# Patient Record
Sex: Male | Born: 1940 | Race: White | Hispanic: No | Marital: Married
Health system: Midwestern US, Academic
[De-identification: ages and names within clinical notes are randomized; demographics above are authoritative.]

---

## 2017-05-12 ENCOUNTER — Encounter: Admit: 2017-05-12 | Discharge: 2017-05-12 | Payer: MEDICARE

## 2017-05-27 ENCOUNTER — Ambulatory Visit: Admit: 2017-05-27 | Discharge: 2017-05-28 | Payer: MEDICARE

## 2017-05-27 ENCOUNTER — Encounter: Admit: 2017-05-27 | Discharge: 2017-05-27 | Payer: MEDICARE

## 2017-05-27 DIAGNOSIS — I4891 Unspecified atrial fibrillation: ICD-10-CM

## 2017-05-27 DIAGNOSIS — E785 Hyperlipidemia, unspecified: ICD-10-CM

## 2017-05-27 DIAGNOSIS — Z950 Presence of cardiac pacemaker: ICD-10-CM

## 2017-05-27 DIAGNOSIS — Z136 Encounter for screening for cardiovascular disorders: ICD-10-CM

## 2017-05-27 DIAGNOSIS — I480 Paroxysmal atrial fibrillation: ICD-10-CM

## 2017-05-27 DIAGNOSIS — R55 Syncope and collapse: ICD-10-CM

## 2017-05-27 DIAGNOSIS — I6523 Occlusion and stenosis of bilateral carotid arteries: Principal | ICD-10-CM

## 2017-05-27 DIAGNOSIS — I2510 Atherosclerotic heart disease of native coronary artery without angina pectoris: Principal | ICD-10-CM

## 2017-05-27 DIAGNOSIS — I10 Essential (primary) hypertension: ICD-10-CM

## 2017-05-27 NOTE — Progress Notes
Date of Service: 05/27/2017    Joshua Keller is a 76 y.o. male.       HPI     Joshua Keller was in the [place] office today for follow-up regarding his rather complicated cardiovascular disease.  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  Basically, I think he is doing well from a cardiovascular standpoint.    He is not having any angina symptoms.  His had no syncope.  He feels that he is slowing down a little bit but has not had specific problems with breathlessness.  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  His fluid status is stable and he has not had problems with fluid retention or claudication.         Vitals:    05/27/17 1422 05/27/17 1432   BP: 126/82 120/86   Pulse: 81    Weight: 85 kg (187 lb 6.4 oz)    Height: 1.702 m (5' 7)      Body mass index is 29.35 kg/m???.     Past Medical History  Patient Active Problem List    Diagnosis Date Noted   ??? Screening for cardiovascular condition 05/27/2017     11/2016 - Abdominal Duplex:  1.8 cm mid-abdominal aorta.  No aneurysm.     ??? Left-sided weakness 04/05/2014   ??? S/P drug eluting coronary stent placement 01/03/2014   ??? PUD (peptic ulcer disease) 01/01/2014   ??? 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  Non-contrast CT-head in [facility] ED negative.  03/2014 - Episode of left leg weakness.  Hospitalized overnight on Neurology service at [facility].  EEG, CT-head negative.  Attributed to focal seizure.  No new meds.     ??? 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  Moderate right external carotid stenosis.  Normal vertebral arteries.    11/2016 - Carotid Duplex:  50-79% (133) RICA, 0-49% (102) LICA.       ??? CAD (coronary artery disease) 09/10/2009     3/94 - Inferior MI. 3/94 - CABG x 4, [facility], Dr. Lennart Pall: LIMA-LAD, SSVG-2 sites of OMB,                  SVG-RCA.        10/98 - EXEC: EF 55%, mild anteroseptal hypokinesis improves with stress, non-ischemic.        4/99 - EXEC: EF 55%, non-ischemic study.        3/01 - EXEC: EF 55-60%, non-ischemic study.        5/02 - Cardiac cath, [place]: LIMA-LAD patent, SVG-RCA patent, sequential graft-OM1 and                  OM2 patent, 50% plaque in graft to RCA (not obstructed), native RCA, LAD and LCX                   totally occluded, EF 50-55%        1/04 - Stress thallium (in-pt): EF 66%, low probability for exercise-induced ischemia.        1/06 - Cardiac cath: definite progression of CAD in SVG to RCA with focal 70-80%  stenosis in mid portion of graft. Native RCA totally occluded. SVG to two Cx                   marginal branches widely patent, as was LIMA to LAD. LVEF 55%.  PCI/stent                   to SVG to RCA (bare metal stent).   12/2013 -    DES to native, distal RCA beyond the insertion of vein graft [name]).  Other finding:  SVG patent to the OM 1 and 2 with a new eccentric 60% to 70%                       stenosis in the distal graft just before the 1st anastomosis. Our recommendation is to manage this conservatively.     ??? Hypertension 09/10/2009   ??? Hyperlipidemia 09/10/2009     3/01 - Total 165, trig 139, HDL 39, LDL 98; Baycol 0.4mg .         1/02 - Total 228, trig 144, HDL 43, LDL 155; no meds.     ??? 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     ??? 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 Atrial fibrillation [facility]) 03/18/2007     12/03 [facility] with AF; Cardizem initiated with conversion to NSR.        1/04 - Transfer [place] d/t recurrence of AF and chest pressure. Cardizem d/c'd d/t                  bradycardia after conversion to NSR.         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 Toprol XL 100mg  initiated.     ??? 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   HENT: Positive for congestion and tinnitus.    Eyes: Negative.    Cardiovascular: Positive for dyspnea on exertion.   Respiratory: Positive for shortness of breath.    Endocrine: Negative.    Hematologic/Lymphatic: Negative.    Skin: Negative.    Musculoskeletal: Positive for arthritis and muscle cramps.   Gastrointestinal: Negative.    Genitourinary: Negative.    Neurological: Negative.    Psychiatric/Behavioral: Negative.    Allergic/Immunologic: Negative.        Physical Exam    Physical Exam   General Appearance: no distress   Skin: warm, no ulcers or xanthomas   Digits and Nails: no cyanosis or clubbing   Eyes: conjunctivae and lids normal, pupils are equal and round   Teeth/Gums/Palate: dentition unremarkable, no lesions   Lips & Oral Mucosa: no pallor or cyanosis   Neck Veins: normal JVP , neck veins are not distended   Thyroid: no nodules, masses, tenderness or enlargement   Chest Inspection: chest is normal in appearance   Respiratory Effort: breathing comfortably, no respiratory distress   Auscultation/Percussion: lungs clear to auscultation, no rales or rhonchi, no wheezing   PMI: PMI not enlarged or displaced   Cardiac Rhythm: regular rhythm and normal rate   Cardiac Auscultation: S1, S2 normal, no rub, no gallop   Murmurs: no murmur   Peripheral Circulation: normal peripheral circulation   Carotid Arteries: normal carotid upstroke bilaterally, no bruits   Radial  Arteries: normal symmetric radial pulses   Abdominal Aorta: no abdominal aortic bruit   Pedal Pulses: normal symmetric pedal pulses   Lower Extremity Edema: no lower extremity edema   Abdominal Exam: soft, non-tender, no masses, bowel sounds normal   Liver & Spleen: no organomegaly   Gait & Station: walks without assistance   Muscle Strength: normal muscle tone Orientation: oriented to time, place and person   Affect & Mood: appropriate and sustained affect   Language and Memory: patient responsive and seems to comprehend information   Neurologic Exam: neurological assessment grossly intact   Other: moves all extremities      Cardiovascular Studies    EKG:  Atrial-paced rhythm, rate 81.  QTc 500 msec.  Non-specific ST-T changes.    Problems Addressed Today  Encounter Diagnoses   Name Primary?   ??? Paroxysmal atrial fibrillation (HCC) Yes   ??? Atherosclerosis of both carotid arteries    ??? Screening for cardiovascular condition    ??? Vasovagal syncope    ??? Pacemaker    ??? Coronary artery disease due to calcified coronary lesion    ??? Essential hypertension    ??? Hyperlipidemia, unspecified hyperlipidemia type        Assessment and Plan       Atrial fibrillation [facility])  He has not always been real sure about his atrial fibrillation based on his symptoms.  He has not had a device check for over 6 months so we will see what his A. fib burden has been when he gets his device checked.    CAD (coronary artery disease)  I've suggested every-other-year stress testing because his CAD has always been clinically silent    Hypertension  BP looks fine on current medical program.    Hyperlipidemia  Lab Results   Component Value Date    CHOL 142 (L) 11/12/2016    TRIG 120 11/12/2016    HDL 36 11/12/2016    LDL 82 11/12/2016    VLDL 24 11/12/2016    NONHDLCHOL 122 04/05/2014    CHOLHDLC 4 11/12/2016          Vasovagal syncope  Pretty much quiescent now.    Pacemaker  He's due for a pacemaker check.      Current Medications (including today's revisions)  ??? aspirin EC 81 mg tablet Take 1 Tab by mouth daily.   ??? lisinopril (PRINIVIL; ZESTRIL) 10 mg tablet TAKE 1 TABLET BY MOUTH EVERY DAY   ??? loratadine (CLARITIN) 10 mg PO tablet Take 10 mg by mouth Daily.   ??? nitroglycerin (NITROSTAT) 0.4 mg tablet Place 1 Tab under tongue every 5 minutes as needed for Chest Pain. ??? omeprazole DR(+) (PRILOSEC) 20 mg capsule Take 20 mg by mouth daily before breakfast.   ??? simvastatin (ZOCOR) 80 mg tablet TAKE 1 TAB BY MOUTH DAILY.   ??? sotalol (BETAPACE) 120 mg tab TAKE 1 TAB BY MOUTH TWICE DAILY.

## 2017-05-28 NOTE — Assessment & Plan Note
Lab Results   Component Value Date    CHOL 142 (L) 11/12/2016    TRIG 120 11/12/2016    HDL 36 11/12/2016    LDL 82 11/12/2016    VLDL 24 11/12/2016    NONHDLCHOL 122 04/05/2014    CHOLHDLC 4 11/12/2016

## 2017-05-28 NOTE — Assessment & Plan Note
I've suggested every-other-year stress testing because his CAD has always been clinically silent

## 2017-05-28 NOTE — Assessment & Plan Note
BP looks fine on current medical program.

## 2017-05-28 NOTE — Assessment & Plan Note
He has not always been real sure about his atrial fibrillation based on his symptoms.  He has not had a device check for over 6 months so we will see what his A. fib burden has been when he gets his device checked.

## 2017-05-28 NOTE — Assessment & Plan Note
Pretty much quiescent now.

## 2017-05-28 NOTE — Assessment & Plan Note
He's due for a pacemaker check.

## 2017-06-17 ENCOUNTER — Encounter: Admit: 2017-06-17 | Discharge: 2017-06-17 | Payer: MEDICARE

## 2017-06-17 ENCOUNTER — Ambulatory Visit: Admit: 2017-06-17 | Discharge: 2017-06-18 | Payer: MEDICARE

## 2017-06-17 DIAGNOSIS — I480 Paroxysmal atrial fibrillation: ICD-10-CM

## 2017-06-17 DIAGNOSIS — I6523 Occlusion and stenosis of bilateral carotid arteries: Principal | ICD-10-CM

## 2017-06-17 DIAGNOSIS — R55 Syncope and collapse: ICD-10-CM

## 2017-06-17 DIAGNOSIS — Z136 Encounter for screening for cardiovascular disorders: ICD-10-CM

## 2017-06-17 DIAGNOSIS — Z950 Presence of cardiac pacemaker: ICD-10-CM

## 2017-06-17 NOTE — Progress Notes
Patient presented to clinic today for device check. Device rep reported increasing AF.   Longest episode 1.5 hours and ave time of 2.8 mins and AF burden is 0%.   [facility] and flowsheet faxed for scanning.  Pt reports he is asymptomatic during the reported episodes.  Advised pt we would call only if [facility].

## 2017-07-20 ENCOUNTER — Encounter: Admit: 2017-07-20 | Discharge: 2017-07-20 | Payer: MEDICARE

## 2017-07-20 NOTE — Telephone Encounter
Device is not MRI compatible. Pt notified and happy with update.

## 2017-07-20 NOTE — Telephone Encounter
[facility] with Trey [facility] at [employer] [facility] and they are wanting to order a MRI of hip for hip pain.  Trey PaulaJeff is asking if PM is MRI compatible.  Flag sent to device nurses to check.  Will call Trey PaulaJeff back at [phone].    Trey PaulaJeff from Dr. Kerin Pernaizza's office left message on webmail asking for pacemaker information because pt is needing a possible MRI.  Faxed requested information to jeff at [phone]

## 2017-07-20 NOTE — Telephone Encounter
-----   Message from Foye Deer, [place] sent at 07/20/2017 11:53 AM CDT -----  Regarding: FW: MRI compatible?      ----- Message -----  From: Betsy Pries, RN  Sent: 07/20/2017  11:21 AM  To: Betsy Pries, RN, [employer]  Subject: MRI compatible?                                  PCP is asking if PM is MRI compatible for MRI hip.  MRI is not urgent, it is for hip pain.     Manufacturer: [employer] Number: [id number]  Serial Number: [id number]  Implant Date: 01/03/2004  Investigational: No  MRI Conditional:      Right Ventricular Lead  Manufacturer: [employer]ant   Model Number: [id number]  Serial Number: [id number]  Implant Date: 02/11/2010    Thanks!

## 2017-08-17 LAB — COMPREHENSIVE METABOLIC PANEL
Lab: 102 — ABNORMAL LOW (ref 23–31)
Lab: 128 — ABNORMAL LOW (ref 136–145)
Lab: 2.3 — ABNORMAL HIGH (ref 0.72–1.25)
Lab: 40 — ABNORMAL HIGH (ref 8.4–25.7)
Lab: 7
Lab: 8.8
Lab: 92

## 2017-08-17 LAB — LACTIC ACID(LACTATE): Lab: 1.1 — ABNORMAL HIGH (ref 3.5–5.1)

## 2017-08-20 LAB — BASIC METABOLIC PANEL: Lab: 139 — ABNORMAL LOW (ref 4.70–6.10)

## 2017-08-20 LAB — CBC: Lab: 3.2 — ABNORMAL LOW (ref 4.8–10.8)

## 2017-08-31 ENCOUNTER — Encounter: Admit: 2017-08-31 | Discharge: 2017-08-31 | Payer: MEDICARE

## 2017-08-31 NOTE — Telephone Encounter
Patient called to report he was inpatient at [facility] 08/17/2017.  Medication changes were made because his blood pressure was running in the 80's.  He was admitted for myalgias, fever and generally not feeling well. He also mentions he will need cardiac clearance for hip surgery on 10/12. I scheduled patient to see [facility] for cardiac clearance and f/u after recent hospitalization to discuss his cardiac med changes.  Pt aware of date, time and location.

## 2017-09-02 ENCOUNTER — Ambulatory Visit: Admit: 2017-09-02 | Discharge: 2017-09-03 | Payer: MEDICARE

## 2017-09-02 ENCOUNTER — Encounter: Admit: 2017-09-02 | Discharge: 2017-09-02 | Payer: MEDICARE

## 2017-09-02 DIAGNOSIS — I2510 Atherosclerotic heart disease of native coronary artery without angina pectoris: ICD-10-CM

## 2017-09-02 DIAGNOSIS — I480 Paroxysmal atrial fibrillation: Principal | ICD-10-CM

## 2017-09-02 DIAGNOSIS — I4891 Unspecified atrial fibrillation: ICD-10-CM

## 2017-09-02 DIAGNOSIS — I10 Essential (primary) hypertension: ICD-10-CM

## 2017-09-02 DIAGNOSIS — Z950 Presence of cardiac pacemaker: ICD-10-CM

## 2017-09-02 DIAGNOSIS — E785 Hyperlipidemia, unspecified: ICD-10-CM

## 2017-09-02 NOTE — Assessment & Plan Note
No evidence of cardiac ischemia related to the recent hospitalization for prostatitis.

## 2017-09-02 NOTE — Assessment & Plan Note
He'll be due for a pacemaker check in December.

## 2017-09-02 NOTE — Assessment & Plan Note
It doesn't sound like he's had any recent exacerbation of atrial arrhythmias.  He's back on sotalol.

## 2017-09-02 NOTE — Assessment & Plan Note
Lab Results   Component Value Date    CHOL 142 (L) 11/12/2016    TRIG 120 11/12/2016    HDL 36 11/12/2016    LDL 82 11/12/2016    VLDL 24 11/12/2016    NONHDLCHOL 122 04/05/2014    CHOLHDLC 4 11/12/2016

## 2017-09-02 NOTE — Progress Notes
Date of Service: 09/02/2017    Joshua Keller is a 76 y.o. male.       HPI     Joshua [place] was in the [place] office today for posthospitalization follow-up.  He went into [facility] a couple of weeks ago with complications of prostatitis.  Sounds like he was pretty sick with a fever and a lot of malaise.  As it turns out he had pretty significant urinary retention due to prostatism.  He saw his urologist and was started on Flomax and says that it made a big difference.    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    Through all of this it does not sound as if he has had any particular cardiac issues.  When he went in with the febrile illness a couple of weeks ago he was hypotensive and the lisinopril was held.  He is lost about 10 pounds through the entire illness and this is probably the reason that his blood pressure is better.  He checks it at home and it is pretty consistently been running less than 130/80 so we decided not to restart the lisinopril today.    He has not had any chest discomfort nor has he had any particular problems with his breathing.  He did not have any atrial fibrillation and he denies any palpitations that would suggest a recurrence of his atrial arrhythmias.         Vitals:    09/02/17 0822 09/02/17 0833   BP: 124/86 130/80   Pulse: 81    Weight: 81.8 kg (180 lb 6.4 oz)    Height: 1.727 m (5' 8)      Body mass index is 27.43 kg/m???.     Past Medical History  Patient Active Problem List    Diagnosis Date Noted   ??? Screening for cardiovascular condition 05/27/2017     11/2016 - Abdominal Duplex:  1.8 cm mid-abdominal aorta.  No aneurysm.     ??? Left-sided weakness 04/05/2014   ??? S/P drug eluting coronary stent placement 01/03/2014   ??? PUD (peptic ulcer disease) 01/01/2014   ??? 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  Non-contrast CT-head in [facility] ED negative.  03/2014 - Episode of left leg weakness.  Hospitalized overnight on Neurology service at [facility].  EEG, CT-head negative.  Attributed to focal seizure.  No new meds.     ??? 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  Moderate right external carotid stenosis.  Normal vertebral arteries.    11/2016 - Carotid Duplex:  50-79% (133) RICA, 0-49% (102) LICA.       ??? CAD (coronary artery disease) 09/10/2009     3/94 - Inferior MI.        3/94 - CABG x 4, [facility], Dr. Lennart Pall: LIMA-LAD, SSVG-2 sites of OMB,                  SVG-RCA.        10/98 - EXEC: EF 55%, mild anteroseptal hypokinesis improves with stress, non-ischemic.        4/99 - EXEC: EF 55%, non-ischemic study.        3/01 - EXEC: EF 55-60%, non-ischemic study.        5/02 -  Cardiac cath, [place]: LIMA-LAD patent, SVG-RCA patent, sequential graft-OM1 and                  OM2 patent, 50% plaque in graft to RCA (not obstructed), native RCA, LAD and LCX                   totally occluded, EF 50-55%        1/04 - Stress thallium (in-pt): EF 66%, low probability for exercise-induced ischemia.        1/06 - Cardiac cath: definite progression of CAD in SVG to RCA with focal 70-80%                   stenosis in mid portion of graft. Native RCA totally occluded. SVG to two Cx                   marginal branches widely patent, as was LIMA to LAD. LVEF 55%.  PCI/stent                   to SVG to RCA (bare metal stent).   12/2013 -    DES to native, distal RCA beyond the insertion of vein graft [name]).  Other finding:  SVG patent to the OM 1 and 2 with a new eccentric 60% to 70%                       stenosis in the distal graft just before the 1st anastomosis. Our recommendation is to manage this conservatively.     ??? Hypertension 09/10/2009   ??? Hyperlipidemia 09/10/2009     3/01 - Total 165, trig 139, HDL 39, LDL 98; Baycol 0.4mg . 1/02 - Total 228, trig 144, HDL 43, LDL 155; no meds.     ??? 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     ??? 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 Atrial fibrillation [facility]) 03/18/2007     12/03 [facility] with AF; Cardizem initiated with conversion to NSR.        1/04 - Transfer [place] d/t recurrence of AF and chest pressure. Cardizem d/c'd d/t                  bradycardia after conversion to NSR.         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 Toprol XL 100mg  initiated.     ??? 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   HENT: Positive for congestion.    Eyes: Negative.    Cardiovascular: Negative.    Respiratory: Positive for cough.    Endocrine: Negative.    Hematologic/Lymphatic: Negative.    Skin: Negative.    Musculoskeletal: Positive for joint pain.   Gastrointestinal: Negative.    Genitourinary: Positive for dysuria.   Neurological: Negative.    Psychiatric/Behavioral: Negative.    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       Physical Exam    Physical Exam   General Appearance: no distress   Skin: warm, no ulcers or xanthomas   Digits and Nails: no cyanosis or clubbing   Eyes: conjunctivae and lids normal, pupils are equal and round   Teeth/Gums/Palate: dentition unremarkable, no lesions  Lips & Oral Mucosa: no pallor or cyanosis   Neck Veins: normal JVP , neck veins are not distended   Thyroid: no nodules, masses, tenderness or enlargement   Chest Inspection: chest is normal in appearance   Respiratory Effort: breathing comfortably, no respiratory distress   Auscultation/Percussion: lungs clear to auscultation, no rales or rhonchi, no wheezing   PMI: PMI not enlarged or displaced   Cardiac Rhythm: regular rhythm and normal rate   Cardiac Auscultation: S1, S2 normal, no rub, no gallop   Murmurs: no murmur Peripheral Circulation: normal peripheral circulation   Carotid Arteries: normal carotid upstroke bilaterally, no bruits   Radial Arteries: normal symmetric radial pulses   Abdominal Aorta: no abdominal aortic bruit   Pedal Pulses: normal symmetric pedal pulses   Lower Extremity Edema: no lower extremity edema   Abdominal Exam: soft, non-tender, no masses, bowel sounds normal   Liver & Spleen: no organomegaly   Gait & Station: walks without assistance   Muscle Strength: normal muscle tone   Orientation: oriented to time, place and person   Affect & Mood: appropriate and sustained affect   Language and Memory: patient responsive and seems to comprehend information   Neurologic Exam: neurological assessment grossly intact   Other: moves all extremities      Cardiovascular Studies    EKG:  Atrial-paced rhythm, rate 81.  RBBB.    Problems Addressed Today  Encounter Diagnoses   Name Primary?   ??? Paroxysmal atrial fibrillation (HCC)    ??? Coronary artery disease due to calcified coronary lesion    ??? Hyperlipidemia, unspecified hyperlipidemia type    ??? Essential hypertension    ??? Pacemaker        Assessment and Plan       Atrial fibrillation (HCC)  It doesn't sound like he's had any recent exacerbation of atrial arrhythmias.  He's back on sotalol.    CAD (coronary artery disease)  No evidence of cardiac ischemia related to the recent hospitalization for prostatitis.    Hyperlipidemia  Lab Results   Component Value Date    CHOL 142 (L) 11/12/2016    TRIG 120 11/12/2016    HDL 36 11/12/2016    LDL 82 11/12/2016    VLDL 24 11/12/2016    NONHDLCHOL 122 04/05/2014    CHOLHDLC 4 11/12/2016          Hypertension  BP has been OK off the lisinopril.  He's lost some weight and this is probably the reason that his BP is better.  I don't want to push his BP too low, given his history of vasovagal syncope.  He'll call me if his BP is running > 14/0/90 and we'll probably need to re-start the lisinopril.    Pacemaker He'll be due for a pacemaker check in December.      Current Medications (including today's revisions)  ??? aspirin EC 81 mg tablet Take 1 Tab by mouth daily.   ??? loratadine (CLARITIN) 10 mg PO tablet Take 10 mg by mouth Daily.   ??? nitroglycerin (NITROSTAT) 0.4 mg tablet Place 1 Tab under tongue every 5 minutes as needed for Chest Pain.   ??? omeprazole DR(+) (PRILOSEC) 20 mg capsule Take 20 mg by mouth daily before breakfast.   ??? simvastatin (ZOCOR) 80 mg tablet TAKE 1 TAB BY MOUTH DAILY.   ??? sotalol (BETAPACE) 120 mg tab TAKE 1 TAB BY MOUTH TWICE DAILY.   ??? tamsulosin (FLOMAX) 0.4 mg capsule Take 0.4 mg by mouth daily.  Do not crush, chew or open capsules. Take 30 minutes following the same meal each day.

## 2017-09-21 LAB — COMPREHENSIVE METABOLIC PANEL: Lab: 134 — ABNORMAL LOW (ref 136–145)

## 2017-09-22 ENCOUNTER — Encounter: Admit: 2017-09-22 | Discharge: 2017-09-22 | Payer: MEDICARE

## 2017-09-22 LAB — BASIC METABOLIC PANEL
Lab: 134 — ABNORMAL LOW (ref 136–145)
Lab: 20 — ABNORMAL LOW (ref 23–31)

## 2017-09-22 LAB — CREATINE KINASE-CPK: Lab: 159

## 2017-09-22 LAB — C REACTIVE PROT-HI SENSITIVITY: Lab: 11 — ABNORMAL HIGH (ref 0.0–0.5)

## 2017-09-22 MED ORDER — LISINOPRIL 10 MG PO TAB
ORAL_TABLET | Freq: Every day | 3 refills | Status: AC
Start: 2017-09-22 — End: 2017-10-19

## 2017-10-07 ENCOUNTER — Encounter: Admit: 2017-10-07 | Discharge: 2017-10-07 | Payer: MEDICARE

## 2017-10-07 MED ORDER — SOTALOL 120 MG PO TAB
ORAL_TABLET | Freq: Two times a day (BID) | ORAL | 2 refills | 30.00000 days | Status: AC
Start: 2017-10-07 — End: 2018-06-23

## 2017-10-07 MED ORDER — SIMVASTATIN 80 MG PO TAB
ORAL_TABLET | Freq: Every day | 2 refills | Status: AC
Start: 2017-10-07 — End: 2018-06-23

## 2017-10-11 ENCOUNTER — Encounter: Admit: 2017-10-11 | Discharge: 2017-10-11 | Payer: MEDICARE

## 2017-10-19 ENCOUNTER — Encounter: Admit: 2017-10-19 | Discharge: 2017-10-19 | Payer: MEDICARE

## 2017-10-19 ENCOUNTER — Ambulatory Visit: Admit: 2017-10-19 | Discharge: 2017-10-20 | Payer: MEDICARE

## 2017-10-19 DIAGNOSIS — Z950 Presence of cardiac pacemaker: ICD-10-CM

## 2017-10-19 DIAGNOSIS — I480 Paroxysmal atrial fibrillation: Principal | ICD-10-CM

## 2017-10-19 DIAGNOSIS — E785 Hyperlipidemia, unspecified: ICD-10-CM

## 2017-10-19 DIAGNOSIS — I4891 Unspecified atrial fibrillation: ICD-10-CM

## 2017-10-19 DIAGNOSIS — I2510 Atherosclerotic heart disease of native coronary artery without angina pectoris: ICD-10-CM

## 2017-10-19 DIAGNOSIS — I10 Essential (primary) hypertension: ICD-10-CM

## 2017-10-19 NOTE — Progress Notes
Date of Service: 10/19/2017    Joshua Keller is a 76 y.o. male.       HPI     Joshua Keller was in the [facility] office today for Cardiology follow-up.  He's had quite a series of illnesses over the past few weeks!    Took Bactrim for prostatitis.  Got weak and ended up in the hospital for 4 days with hypotension and renal insufficiency.  Then 2 weeks later he had arthroscopic surgery on his hip.  He got thorazine for hiccups and got so weak he was re-hospitalized.  Got Prednisone from Dr. Alona Bene, last pill tomorrow.  Has URI now.    Flomax has helped with prostate.  Renal function back to normal.    Through all these difficulties he does not seem to have had any cardiac problems.  He has not had any angina or lightheadedness.  Denies any palpitations.         Vitals:    10/19/17 1507   BP: 132/70   Pulse: 81   Weight: 85.3 kg (188 lb)   Height: 1.727 m (5' 8)     Body mass index is 28.59 kg/m???.     Past Medical History  Patient Active Problem List    Diagnosis Date Noted   ??? Screening for cardiovascular condition 05/27/2017     11/2016 - Abdominal Duplex:  1.8 cm mid-abdominal aorta.  No aneurysm.     ??? Left-sided weakness 04/05/2014   ??? S/P drug eluting coronary stent placement 01/03/2014   ??? PUD (peptic ulcer disease) 01/01/2014   ??? 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  Non-contrast CT-head in [facility] ED negative.  03/2014 - Episode of left leg weakness.  Hospitalized overnight on Neurology service at [facility].  EEG, CT-head negative.  Attributed to focal seizure.  No new meds.     ??? 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  Moderate right external carotid stenosis.  Normal vertebral arteries.    11/2016 - Carotid Duplex:  50-79% (133) RICA, 0-49% (102) LICA.       ??? CAD (coronary artery disease) 09/10/2009     3/94 - Inferior MI. 3/94 - CABG x 4, [facility], Dr. Lennart Pall: LIMA-LAD, SSVG-2 sites of OMB,                  SVG-RCA.        10/98 - EXEC: EF 55%, mild anteroseptal hypokinesis improves with stress, non-ischemic.        4/99 - EXEC: EF 55%, non-ischemic study.        3/01 - EXEC: EF 55-60%, non-ischemic study.        5/02 - Cardiac cath, [facility]: LIMA-LAD patent, SVG-RCA patent, sequential graft-OM1 and                  OM2 patent, 50% plaque in graft to RCA (not obstructed), native RCA, LAD and LCX                   totally occluded, EF 50-55%        1/04 - Stress thallium (in-pt): EF 66%, low probability for exercise-induced ischemia.        1/06 - Cardiac cath: definite progression of CAD in SVG to RCA with focal 70-80%  stenosis in mid portion of graft. Native RCA totally occluded. SVG to two Cx                   marginal branches widely patent, as was LIMA to LAD. LVEF 55%.  PCI/stent                   to SVG to RCA (bare metal stent).   12/2013 -    DES to native, distal RCA beyond the insertion of vein graft [name]).  Other finding:  SVG patent to the OM 1 and 2 with a new eccentric 60% to 70%                       stenosis in the distal graft just before the 1st anastomosis. Our recommendation is to manage this conservatively.     ??? Hypertension 09/10/2009   ??? Hyperlipidemia 09/10/2009     3/01 - Total 165, trig 139, HDL 39, LDL 98; Baycol 0.4mg .         1/02 - Total 228, trig 144, HDL 43, LDL 155; no meds.     ??? 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     ??? 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 Atrial fibrillation [facility]) 03/18/2007     12/03 [facility] with AF; Cardizem initiated with conversion to NSR.        1/04 - Transfer [facility] d/t recurrence of AF and chest pressure. Cardizem d/c'd d/t                  bradycardia after conversion to NSR.         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 Toprol XL 100mg  initiated.     ??? 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   HENT: Negative.    Eyes: Negative.    Cardiovascular: Negative.    Respiratory: Negative.    Endocrine: Negative.    Hematologic/Lymphatic: Negative.    Skin: Negative.    Musculoskeletal: Negative.    Gastrointestinal: Negative.    Genitourinary: Negative.    Neurological: Negative.    Psychiatric/Behavioral: Negative.    Allergic/Immunologic: Negative.        Physical Exam    Physical Exam   General Appearance: no distress   Skin: warm, no ulcers or xanthomas   Digits and Nails: no cyanosis or clubbing   Eyes: conjunctivae and lids normal, pupils are equal and round   Teeth/Gums/Palate: dentition unremarkable, no lesions   Lips & Oral Mucosa: no pallor or cyanosis   Neck Veins: normal JVP , neck veins are not distended   Thyroid: no nodules, masses, tenderness or enlargement   Chest Inspection: chest is normal in appearance   Respiratory Effort: breathing comfortably, no respiratory distress   Auscultation/Percussion: lungs clear to auscultation, no rales or rhonchi, no wheezing   PMI: PMI not enlarged or displaced   Cardiac Rhythm: regular rhythm and normal rate   Cardiac Auscultation: S1, S2 normal, no rub, no gallop   Murmurs: no murmur   Peripheral Circulation: normal peripheral circulation   Carotid Arteries: normal carotid upstroke bilaterally, no bruits   Radial Arteries: normal symmetric radial pulses   Abdominal Aorta: no abdominal aortic bruit   Pedal Pulses: normal  symmetric pedal pulses   Lower Extremity Edema: no lower extremity edema   Abdominal Exam: soft, non-tender, no masses, bowel sounds normal   Liver & Spleen: no organomegaly   Gait & Station: walks without assistance   Muscle Strength: normal muscle tone   Orientation: oriented to time, place and person   Affect & Mood: appropriate and sustained affect Language and Memory: patient responsive and seems to comprehend information   Neurologic Exam: neurological assessment grossly intact   Other: moves all extremities        Problems Addressed Today  Encounter Diagnoses   Name Primary?   ??? Paroxysmal atrial fibrillation (HCC) Yes   ??? Coronary artery disease due to calcified coronary lesion    ??? Pacemaker    ??? Hyperlipidemia, unspecified hyperlipidemia type        Assessment and Plan       Pacemaker  He is due for a pacemaker check and we will make arrangements to have one done within the next few weeks.    Hyperlipidemia  The last lipid profile we have is from February, 2017.  We will make arrangements for him to have a follow-up lipid profile.    CAD (coronary artery disease)  No angina or other symptoms suggesting progression of coronary disease.    Atrial fibrillation [facility])  He can usually tell when he is having episodes of atrial fibrillation.  We will see what his pacemaker check shows.      Current Medications (including today's revisions)  ??? aspirin EC 81 mg tablet Take 1 Tab by mouth daily.   ??? loratadine (CLARITIN) 10 mg PO tablet Take 10 mg by mouth Daily.   ??? nitroglycerin (NITROSTAT) 0.4 mg tablet Place 1 Tab under tongue every 5 minutes as needed for Chest Pain.   ??? omeprazole DR(+) (PRILOSEC) 20 mg capsule Take 20 mg by mouth daily before breakfast.   ??? simvastatin (ZOCOR) 80 mg tablet TAKE 1 TABLET BY MOUTH DAILY   ??? sotalol (BETAPACE) 120 mg tab TAKE 1 TABLET BY MOUTH TWICE A DAY   ??? tamsulosin (FLOMAX) 0.4 mg capsule Take 0.4 mg by mouth daily. Do not crush, chew or open capsules. Take 30 minutes following the same meal each day.

## 2017-11-18 NOTE — Assessment & Plan Note
He can usually tell when he is having episodes of atrial fibrillation.  We will see what his pacemaker check shows.

## 2017-11-18 NOTE — Assessment & Plan Note
The last lipid profile we have is from February, 2017.  We will make arrangements for him to have a follow-up lipid profile.

## 2017-11-18 NOTE — Assessment & Plan Note
No angina or other symptoms suggesting progression of coronary disease.

## 2017-11-18 NOTE — Assessment & Plan Note
He is due for a pacemaker check and we will make arrangements to have one done within the next few weeks.

## 2017-12-12 LAB — COMPREHENSIVE METABOLIC PANEL
Lab: 0.4
Lab: 1.2
Lab: 110 — ABNORMAL HIGH (ref 98–107)
Lab: 142 — ABNORMAL LOW (ref 4.70–6.10)
Lab: 16 — ABNORMAL HIGH (ref 0–14)
Lab: 18
Lab: 20
Lab: 20 — ABNORMAL LOW (ref 23–31)
Lab: 23
Lab: 4.1
Lab: 47
Lab: 6.9
Lab: 9.4
Lab: 94

## 2017-12-12 LAB — CREATINE KINASE-CPK: Lab: 39 — ABNORMAL LOW (ref 14.0–18.0)

## 2017-12-12 LAB — CBC: Lab: 6

## 2018-02-04 ENCOUNTER — Encounter: Admit: 2018-02-04 | Discharge: 2018-02-04 | Payer: MEDICARE

## 2018-02-04 DIAGNOSIS — I2510 Atherosclerotic heart disease of native coronary artery without angina pectoris: ICD-10-CM

## 2018-02-04 DIAGNOSIS — Z01818 Encounter for other preprocedural examination: Principal | ICD-10-CM

## 2018-02-10 ENCOUNTER — Encounter: Admit: 2018-02-10 | Discharge: 2018-02-10 | Payer: MEDICARE

## 2018-02-10 ENCOUNTER — Ambulatory Visit: Admit: 2018-02-10 | Discharge: 2018-02-11 | Payer: MEDICARE

## 2018-02-10 DIAGNOSIS — Z01818 Encounter for other preprocedural examination: Principal | ICD-10-CM

## 2018-02-11 ENCOUNTER — Encounter: Admit: 2018-02-11 | Discharge: 2018-02-11 | Payer: MEDICARE

## 2018-02-15 ENCOUNTER — Encounter: Admit: 2018-02-15 | Discharge: 2018-02-15 | Payer: MEDICARE

## 2018-06-07 ENCOUNTER — Encounter: Admit: 2018-06-07 | Discharge: 2018-06-07 | Payer: MEDICARE

## 2018-06-07 DIAGNOSIS — I2510 Atherosclerotic heart disease of native coronary artery without angina pectoris: Principal | ICD-10-CM

## 2018-06-07 DIAGNOSIS — I10 Essential (primary) hypertension: ICD-10-CM

## 2018-06-07 DIAGNOSIS — E785 Hyperlipidemia, unspecified: ICD-10-CM

## 2018-06-07 DIAGNOSIS — I6523 Occlusion and stenosis of bilateral carotid arteries: ICD-10-CM

## 2018-06-14 ENCOUNTER — Encounter: Admit: 2018-06-14 | Discharge: 2018-06-14 | Payer: MEDICARE

## 2018-06-14 ENCOUNTER — Ambulatory Visit: Admit: 2018-06-14 | Discharge: 2018-06-15 | Payer: MEDICARE

## 2018-06-14 DIAGNOSIS — I6523 Occlusion and stenosis of bilateral carotid arteries: ICD-10-CM

## 2018-06-14 DIAGNOSIS — I10 Essential (primary) hypertension: ICD-10-CM

## 2018-06-14 DIAGNOSIS — I480 Paroxysmal atrial fibrillation: Principal | ICD-10-CM

## 2018-06-14 DIAGNOSIS — E785 Hyperlipidemia, unspecified: ICD-10-CM

## 2018-06-14 DIAGNOSIS — I2510 Atherosclerotic heart disease of native coronary artery without angina pectoris: Principal | ICD-10-CM

## 2018-06-14 DIAGNOSIS — Z950 Presence of cardiac pacemaker: ICD-10-CM

## 2018-06-14 LAB — LIPID PROFILE
Lab: 36
Lab: 4
Lab: 160
Lab: 244 — ABNORMAL HIGH (ref 30–200)

## 2018-06-23 ENCOUNTER — Encounter: Admit: 2018-06-23 | Discharge: 2018-06-23 | Payer: MEDICARE

## 2018-06-23 ENCOUNTER — Ambulatory Visit: Admit: 2018-06-23 | Discharge: 2018-06-24 | Payer: MEDICARE

## 2018-06-23 DIAGNOSIS — E785 Hyperlipidemia, unspecified: ICD-10-CM

## 2018-06-23 DIAGNOSIS — I10 Essential (primary) hypertension: ICD-10-CM

## 2018-06-23 DIAGNOSIS — I2510 Atherosclerotic heart disease of native coronary artery without angina pectoris: Principal | ICD-10-CM

## 2018-06-23 DIAGNOSIS — I4891 Unspecified atrial fibrillation: ICD-10-CM

## 2018-06-23 DIAGNOSIS — Z950 Presence of cardiac pacemaker: Principal | ICD-10-CM

## 2018-06-23 MED ORDER — SIMVASTATIN 80 MG PO TAB
ORAL_TABLET | Freq: Every day | 3 refills | Status: AC
Start: 2018-06-23 — End: 2019-06-26

## 2018-06-23 MED ORDER — SOTALOL 120 MG PO TAB
ORAL_TABLET | Freq: Two times a day (BID) | ORAL | 3 refills | 30.00000 days | Status: AC
Start: 2018-06-23 — End: 2019-06-26

## 2018-11-24 ENCOUNTER — Encounter: Admit: 2018-11-24 | Discharge: 2018-11-24 | Payer: MEDICARE

## 2018-11-24 MED ORDER — NITROGLYCERIN 0.4 MG SL SUBL
.4 mg | ORAL_TABLET | SUBLINGUAL | 3 refills | 9.00000 days | Status: AC | PRN
Start: 2018-11-24 — End: ?

## 2018-12-20 ENCOUNTER — Ambulatory Visit: Admit: 2018-12-20 | Discharge: 2018-12-21 | Payer: MEDICARE

## 2018-12-20 ENCOUNTER — Encounter: Admit: 2018-12-20 | Discharge: 2018-12-20 | Payer: MEDICARE

## 2018-12-20 DIAGNOSIS — Z950 Presence of cardiac pacemaker: Secondary | ICD-10-CM

## 2018-12-20 DIAGNOSIS — I480 Paroxysmal atrial fibrillation: Secondary | ICD-10-CM

## 2018-12-20 DIAGNOSIS — R55 Syncope and collapse: Secondary | ICD-10-CM

## 2018-12-27 ENCOUNTER — Ambulatory Visit: Admit: 2018-12-27 | Discharge: 2018-12-28 | Payer: MEDICARE

## 2018-12-27 ENCOUNTER — Encounter: Admit: 2018-12-27 | Discharge: 2018-12-27 | Payer: MEDICARE

## 2018-12-27 DIAGNOSIS — I10 Essential (primary) hypertension: Secondary | ICD-10-CM

## 2018-12-27 DIAGNOSIS — I480 Paroxysmal atrial fibrillation: Secondary | ICD-10-CM

## 2018-12-27 DIAGNOSIS — I4891 Unspecified atrial fibrillation: Secondary | ICD-10-CM

## 2018-12-27 DIAGNOSIS — E785 Hyperlipidemia, unspecified: Secondary | ICD-10-CM

## 2018-12-27 DIAGNOSIS — I2510 Atherosclerotic heart disease of native coronary artery without angina pectoris: Secondary | ICD-10-CM

## 2018-12-27 DIAGNOSIS — Z950 Presence of cardiac pacemaker: Secondary | ICD-10-CM

## 2018-12-27 DIAGNOSIS — R55 Syncope and collapse: Secondary | ICD-10-CM

## 2019-01-01 IMAGING — CR CHEST
1 series · 1 of 1 positions shown · non-contrast
Comparison: none

[chest port x-wise]
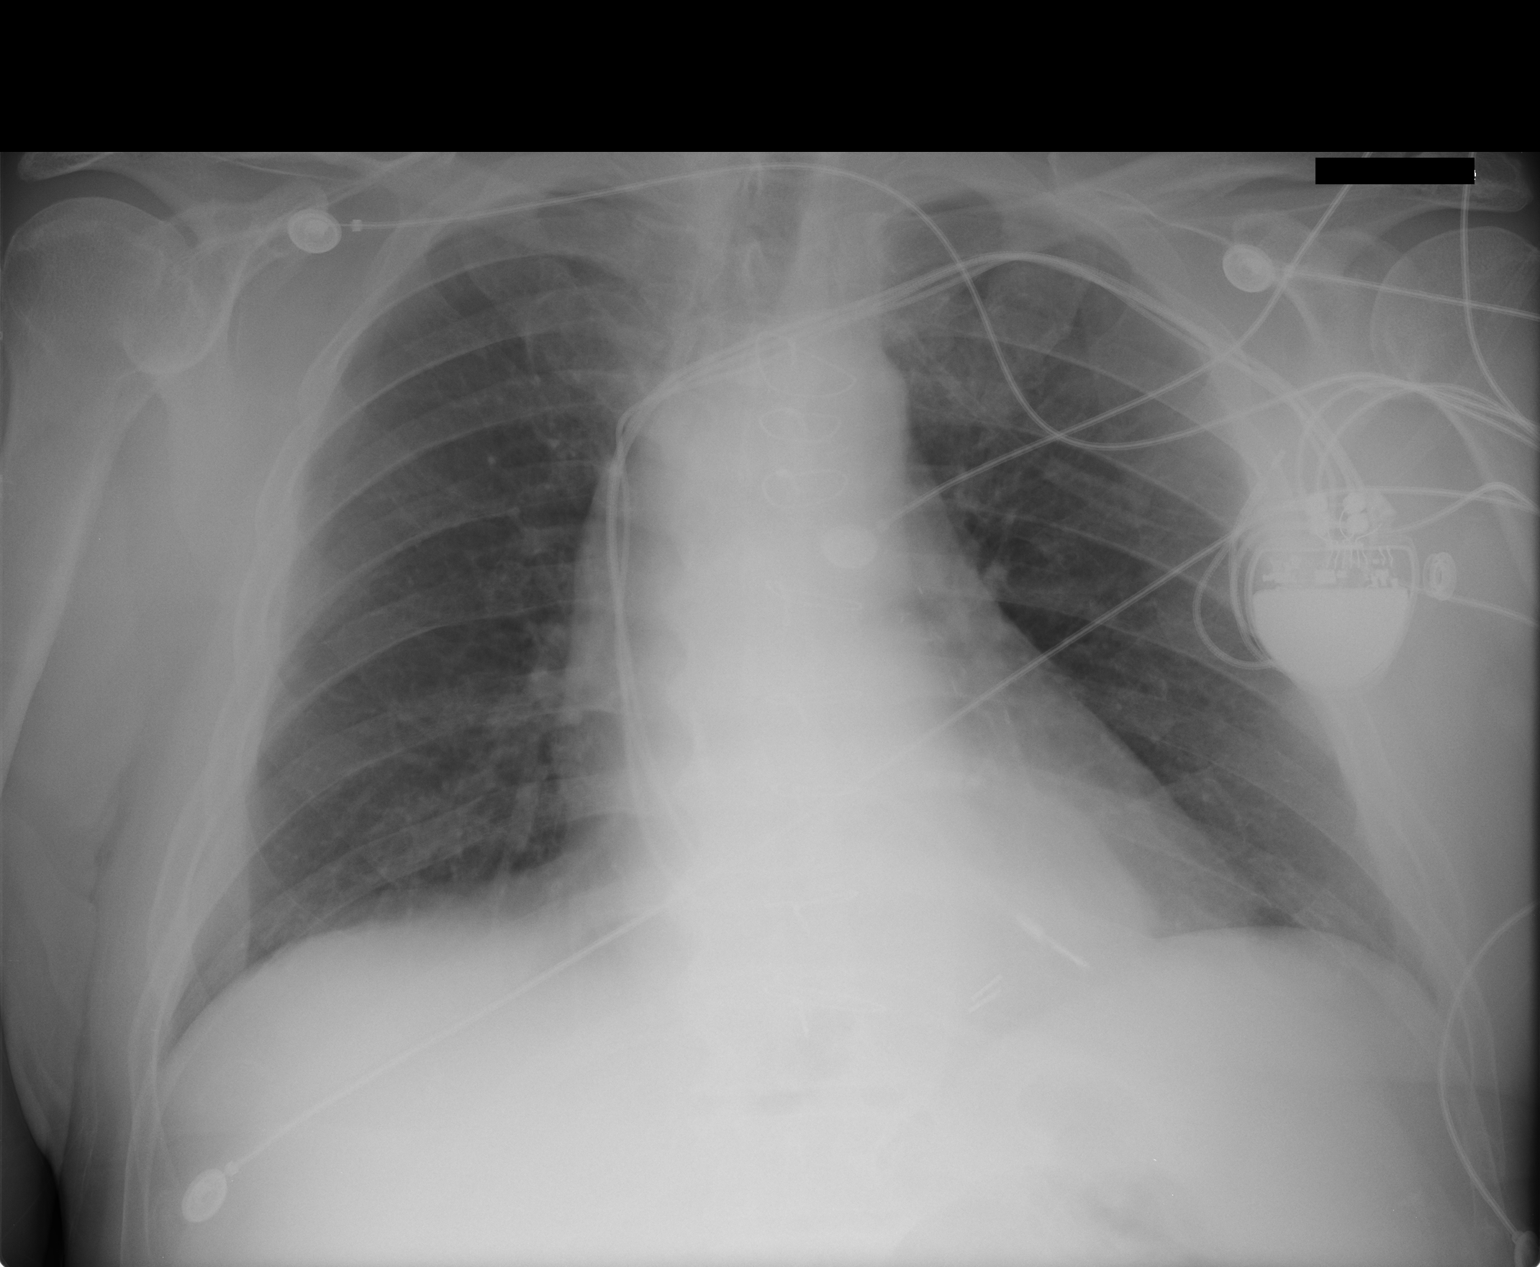

[1 of 1 positions shown; findings below may reference images not displayed]

DIAGNOSTIC STUDIES

EXAM

RADIOLOGICAL EXAMINATION, CHEST; SINGLE VIEW, FRONTAL CPT 14646

INDICATION

fever
WEAKNESS, FEVER, FATIGUE, SORE NECK

TECHNIQUE

Single AP view chest

COMPARISONS

Two-view chest November 05, 2016

FINDINGS

There are mediastinal wires and dual chamber pacer. There is no focal consolidation, effusion, or
pneumothorax. Cardiac silhouette is within normal limits. The bony thorax is intact.

IMPRESSION

No acute cardiopulmonary abnormality.

## 2019-05-31 ENCOUNTER — Encounter: Admit: 2019-05-31 | Discharge: 2019-05-31

## 2019-06-26 ENCOUNTER — Encounter: Admit: 2019-06-26 | Discharge: 2019-06-26

## 2019-06-26 MED ORDER — SOTALOL 120 MG PO TAB
120 mg | ORAL_TABLET | Freq: Two times a day (BID) | ORAL | 3 refills | 30.00000 days | Status: DC
Start: 2019-06-26 — End: 2020-06-17

## 2019-06-26 MED ORDER — SIMVASTATIN 80 MG PO TAB
80 mg | ORAL_TABLET | Freq: Every day | ORAL | 3 refills | Status: DC
Start: 2019-06-26 — End: 2020-06-17

## 2019-11-07 ENCOUNTER — Encounter: Admit: 2019-11-07 | Discharge: 2019-11-07 | Payer: MEDICARE

## 2019-11-07 DIAGNOSIS — Z950 Presence of cardiac pacemaker: Secondary | ICD-10-CM

## 2020-01-02 ENCOUNTER — Encounter: Admit: 2020-01-02 | Discharge: 2020-01-02 | Payer: MEDICARE

## 2020-01-05 ENCOUNTER — Encounter: Admit: 2020-01-05 | Discharge: 2020-01-05 | Payer: MEDICARE

## 2020-01-05 DIAGNOSIS — E785 Hyperlipidemia, unspecified: Secondary | ICD-10-CM

## 2020-01-05 DIAGNOSIS — I480 Paroxysmal atrial fibrillation: Secondary | ICD-10-CM

## 2020-01-05 DIAGNOSIS — I2510 Atherosclerotic heart disease of native coronary artery without angina pectoris: Secondary | ICD-10-CM

## 2020-01-08 ENCOUNTER — Ambulatory Visit: Admit: 2020-01-08 | Discharge: 2020-01-08 | Payer: MEDICARE

## 2020-01-08 ENCOUNTER — Encounter: Admit: 2020-01-08 | Discharge: 2020-01-08 | Payer: MEDICARE

## 2020-01-08 DIAGNOSIS — Z950 Presence of cardiac pacemaker: Secondary | ICD-10-CM

## 2020-01-09 ENCOUNTER — Encounter: Admit: 2020-01-09 | Discharge: 2020-01-09 | Payer: MEDICARE

## 2020-01-09 DIAGNOSIS — I10 Essential (primary) hypertension: Secondary | ICD-10-CM

## 2020-01-09 DIAGNOSIS — I4891 Unspecified atrial fibrillation: Secondary | ICD-10-CM

## 2020-01-09 DIAGNOSIS — I2510 Atherosclerotic heart disease of native coronary artery without angina pectoris: Secondary | ICD-10-CM

## 2020-01-09 DIAGNOSIS — E785 Hyperlipidemia, unspecified: Secondary | ICD-10-CM

## 2020-01-09 DIAGNOSIS — Z950 Presence of cardiac pacemaker: Secondary | ICD-10-CM

## 2020-01-09 DIAGNOSIS — I480 Paroxysmal atrial fibrillation: Secondary | ICD-10-CM

## 2020-01-09 NOTE — Progress Notes
Date of Service: 01/09/2020    Joshua Keller is a 79 y.o. male.       HPI     Joshua Keller was in the [place] clinic today for follow-up regarding his pacemaker and coronary disease.  I have not seen him for a year, but he seems to be stable from a cardiovascular standpoint.    He had to drive down to [place] for his pacemaker check yesterday.  He has about 1 year of battery longevity.  His device is apparently one for which remote transmissions are not possible so we will need to be doing in office device checks more frequently.  He does use his pacemaker, pacing his atria essentially 100% of the time and pacing the ventricle about half of the time.    He denies any problems with chest discomfort.  He is a long history of some vagal lightheadedness but has not had any recent problems.  He denies any breathlessness.  He has had no palpitations or lightheadedness.    He takes sotalol and the drug has been very effective in suppressing paroxysmal atrial fibrillation.  His device check yesterday indicated about 40 seconds of atrial fibrillation 1 day in mid December.  He was not symptomatic at the time.    He denies any TIA or stroke symptoms.  He has been maintained on aspirin and we have not used oral anticoagulation for a number of years because his burden is quite low.  He has always been pretty symptomatic when he has been in atrial fibrillation in the past.         Vitals:    01/09/20 0837 01/09/20 0846   BP: 122/74 124/76   BP Source: Arm, Left Upper Arm, Right Upper   Patient Position: Sitting Sitting   Pulse: 81    SpO2: 98%    Weight: 88 kg (194 lb)    Height: 1.702 m (5' 7)    PainSc: Zero      Body mass index is 30.38 kg/m?[name]     Past Medical History  Patient Active Problem List    Diagnosis Date Noted   ? Screening for cardiovascular condition 05/27/2017     11/2016 - Abdominal Duplex:  1.8 cm mid-abdominal aorta.  No aneurysm.     ? Left-sided weakness 04/05/2014 ? S/P drug eluting coronary stent placement 01/03/2014   ? PUD (peptic ulcer disease) 01/01/2014   ? 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  Non-contrast CT-head in [facility] ED negative.  03/2014 - Episode of left leg weakness.  Hospitalized overnight on Neurology service at [facility].  EEG, CT-head negative.  Attributed to focal seizure.  No new meds.     ? 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  Moderate right external carotid stenosis.  Normal vertebral arteries.    11/2016 - Carotid Duplex:  50-79% (133) RICA, 0-49% (102) LICA.       ? CAD (coronary artery disease) 09/10/2009     3/94 - Inferior MI.        3/94 - CABG x 4, [facility], Dr. Lennart Pall: LIMA-LAD, SSVG-2 sites of OMB,                  SVG-RCA.        10/98 - EXEC: EF 55%, mild anteroseptal hypokinesis improves with stress, non-ischemic.  4/99 - EXEC: EF 55%, non-ischemic study.        3/01 - EXEC: EF 55-60%, non-ischemic study.        5/02 - Cardiac cath, [place]: LIMA-LAD patent, SVG-RCA patent, sequential graft-OM1 and                  OM2 patent, 50% plaque in graft to RCA (not obstructed), native RCA, LAD and LCX                   totally occluded, EF 50-55%        1/04 - Stress thallium (in-pt): EF 66%, low probability for exercise-induced ischemia.        1/06 - Cardiac cath: definite progression of CAD in SVG to RCA with focal 70-80%                   stenosis in mid portion of graft. Native RCA totally occluded. SVG to two Cx                   marginal branches widely patent, as was LIMA to LAD. LVEF 55%.  PCI/stent                   to SVG to RCA (bare metal stent). 12/2013 -    DES to native, distal RCA beyond the insertion of vein graft [name]).  Other finding:  SVG patent to the OM 1 and 2 with a new eccentric 60% to 70%                       stenosis in the distal graft just before the 1st anastomosis. Our recommendation is to manage this conservatively.     ? Hypertension 09/10/2009   ? Hyperlipidemia 09/10/2009     3/01 - Total 165, trig 139, HDL 39, LDL 98; Baycol 0.4mg .         1/02 - Total 228, trig 144, HDL 43, LDL 155; no meds.     ? 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     ? 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 Atrial fibrillation [facility]) 03/18/2007     12/03 [facility] with AF; Cardizem initiated with conversion to NSR.        1/04 - Transfer [place] d/t recurrence of AF and chest pressure. Cardizem d/c'd d/t                  bradycardia after conversion to NSR.         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 Toprol XL 100mg  initiated.     ? 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   HENT: Negative.    Eyes: Negative.    Cardiovascular: Positive for dyspnea on exertion.   Respiratory: Negative.    Endocrine: Negative.    Hematologic/Lymphatic: Negative.    Skin: Negative.    Musculoskeletal: Positive for arthritis, back pain, joint pain and neck pain.   Gastrointestinal: Negative.    Genitourinary: Negative.    Neurological: Negative.    Psychiatric/Behavioral: Negative.    Allergic/Immunologic: Negative.        Physical Exam    Physical Exam   General Appearance: no distress  Skin: warm, no ulcers or xanthomas   Digits and Nails: no cyanosis or clubbing   Eyes: conjunctivae and lids normal, pupils are equal and round   Teeth/Gums/Palate: dentition unremarkable, no lesions   Lips & Oral Mucosa: no pallor or cyanosis   Neck Veins: normal JVP , neck veins are not distended Thyroid: no nodules, masses, tenderness or enlargement   Chest Inspection: chest is normal in appearance   Respiratory Effort: breathing comfortably, no respiratory distress   Auscultation/Percussion: lungs clear to auscultation, no rales or rhonchi, no wheezing   PMI: PMI not enlarged or displaced   Cardiac Rhythm: regular rhythm and normal rate   Cardiac Auscultation: S1, S2 normal, no rub, no gallop   Murmurs: no murmur   Peripheral Circulation: normal peripheral circulation   Carotid Arteries: normal carotid upstroke bilaterally, no bruits   Radial Arteries: normal symmetric radial pulses   Abdominal Aorta: no abdominal aortic bruit   Pedal Pulses: normal symmetric pedal pulses   Lower Extremity Edema: no lower extremity edema   Abdominal Exam: soft, non-tender, no masses, bowel sounds normal   Liver & Spleen: no organomegaly   Gait & Station: walks without assistance   Muscle Strength: normal muscle tone   Orientation: oriented to time, place and person   Affect & Mood: appropriate and sustained affect   Language and Memory: patient responsive and seems to comprehend information   Neurologic Exam: neurological assessment grossly intact   Other: moves all extremities      Cardiovascular Studies    EKG:  A-paced rhythm.  RBBB.  QTc 450 msec    Problems Addressed Today  Encounter Diagnoses   Name Primary?   ? Paroxysmal atrial fibrillation (HCC) Yes   ? Coronary artery disease involving native coronary artery of native heart without angina pectoris    ? Essential hypertension    ? Hyperlipidemia, unspecified hyperlipidemia type    ? Pacemaker        Assessment and Plan       Atrial fibrillation (HCC)  He is tolerating sotalol and his QT interval is acceptable.  His device check shows negligible atrial fibrillation burden.    CAD (coronary artery disease)  No angina or other symptoms to suggest progression of coronary disease.    Hypertension Blood pressure looks fine on the current medical program.  We have been careful not to treat him too aggressively because of his tendency to have postural lightheadedness at times.    Hyperlipidemia  Lab Results   Component Value Date    CHOL 155 12/27/2018    TRIG 273 (H) 12/27/2018    HDL 37 12/27/2018    LDL 80 12/27/2018    VLDL 55 (H) 12/27/2018    NONHDLCHOL 122 04/05/2014    CHOLHDLC 4 12/27/2018      LDL close to goal.    Pacemaker  We will schedule a device check in 3 months because of his nearing ERI.      Current Medications (including today's revisions)  ? aspirin EC 81 mg tablet Take 1 Tab by mouth daily.   ? loratadine (CLARITIN) 10 mg PO tablet Take 10 mg by mouth Daily.   ? nitroglycerin (NITROSTAT) 0.4 mg tablet Place one tablet under tongue every 5 minutes as needed for Chest Pain.   ? omeprazole DR(+) (PRILOSEC) 20 mg capsule Take 20 mg by mouth as Needed.   ? prednisone (DELTASONE) 1 mg tablet Take 3-4 mg by mouth daily with breakfast.   ?  simvastatin (ZOCOR) 80 mg tablet Take one tablet by mouth daily.   ? sotaloL (BETAPACE) 120 mg tab Take one tablet by mouth twice daily.   ? tamsulosin (FLOMAX) 0.4 mg capsule Take 0.4 mg by mouth daily. Do not crush, chew or open capsules. Take 30 minutes following the same meal each day.     Total time spent on today's office visit was 30 minutes.  This includes face-to-face in person visit with patient as well as nonface-to-face time including review of the EMR, outside records, labs, radiologic studies, echocardiogram & other cardiovascular studies, formation of treatment plan, after visit summary, future disposition, and lastly on documentation.

## 2020-01-09 NOTE — Assessment & Plan Note
Lab Results   Component Value Date    CHOL 155 12/27/2018    TRIG 273 (H) 12/27/2018    HDL 37 12/27/2018    LDL 80 12/27/2018    VLDL 55 (H) 12/27/2018    NONHDLCHOL 122 04/05/2014    CHOLHDLC 4 12/27/2018      LDL close to goal.

## 2020-01-09 NOTE — Assessment & Plan Note
No angina or other symptoms to suggest progression of coronary disease.

## 2020-01-09 NOTE — Assessment & Plan Note
Blood pressure looks fine on the current medical program.  We have been careful not to treat him too aggressively because of his tendency to have postural lightheadedness at times.

## 2020-01-09 NOTE — Assessment & Plan Note
We will schedule a device check in 3 months because of his nearing ERI.

## 2020-01-09 NOTE — Assessment & Plan Note
He is tolerating sotalol and his QT interval is acceptable.  His device check shows negligible atrial fibrillation burden.

## 2020-04-30 ENCOUNTER — Ambulatory Visit: Admit: 2020-04-30 | Discharge: 2020-04-30 | Payer: MEDICARE

## 2020-04-30 ENCOUNTER — Encounter: Admit: 2020-04-30 | Discharge: 2020-04-30 | Payer: MEDICARE

## 2020-04-30 DIAGNOSIS — Z950 Presence of cardiac pacemaker: Secondary | ICD-10-CM

## 2020-05-23 ENCOUNTER — Ambulatory Visit: Admit: 2020-05-23 | Discharge: 2020-05-23 | Payer: MEDICARE

## 2020-05-23 ENCOUNTER — Encounter: Admit: 2020-05-23 | Discharge: 2020-05-23 | Payer: MEDICARE

## 2020-05-23 DIAGNOSIS — Z950 Presence of cardiac pacemaker: Secondary | ICD-10-CM

## 2020-05-24 ENCOUNTER — Encounter: Admit: 2020-05-24 | Discharge: 2020-05-24 | Payer: MEDICARE

## 2020-06-17 ENCOUNTER — Encounter: Admit: 2020-06-17 | Discharge: 2020-06-17 | Payer: MEDICARE

## 2020-06-17 MED ORDER — SIMVASTATIN 80 MG PO TAB
ORAL_TABLET | Freq: Every day | 3 refills | Status: DC
Start: 2020-06-17 — End: 2020-07-15

## 2020-06-17 MED ORDER — SOTALOL 120 MG PO TAB
ORAL_TABLET | Freq: Two times a day (BID) | ORAL | 3 refills | 30.00000 days | Status: AC
Start: 2020-06-17 — End: ?

## 2020-06-18 ENCOUNTER — Encounter: Admit: 2020-06-18 | Discharge: 2020-06-18 | Payer: MEDICARE

## 2020-06-20 ENCOUNTER — Encounter: Admit: 2020-06-20 | Discharge: 2020-06-20 | Payer: MEDICARE

## 2020-06-20 DIAGNOSIS — R55 Syncope and collapse: Secondary | ICD-10-CM

## 2020-06-20 DIAGNOSIS — I2510 Atherosclerotic heart disease of native coronary artery without angina pectoris: Secondary | ICD-10-CM

## 2020-06-20 DIAGNOSIS — Z950 Presence of cardiac pacemaker: Secondary | ICD-10-CM

## 2020-06-20 DIAGNOSIS — E785 Hyperlipidemia, unspecified: Secondary | ICD-10-CM

## 2020-06-20 DIAGNOSIS — I10 Essential (primary) hypertension: Secondary | ICD-10-CM

## 2020-06-20 DIAGNOSIS — I480 Paroxysmal atrial fibrillation: Secondary | ICD-10-CM

## 2020-06-20 DIAGNOSIS — I4891 Unspecified atrial fibrillation: Secondary | ICD-10-CM

## 2020-06-20 NOTE — Assessment & Plan Note
This seems to be quiescent currently.

## 2020-06-20 NOTE — Assessment & Plan Note
His device check does not show any recurrent atrial arrhythmia.  QT interval looks fine on sotalol.

## 2020-06-20 NOTE — Assessment & Plan Note
He has never had typical symptoms and I recommended surveillance stress testing intermittently.  We will plan a stress thallium when he is back in 6 months.

## 2020-06-20 NOTE — Progress Notes
Date of Service: 06/20/2020    Joshua Keller is a 79 y.o. male.       HPI     Joshua Keller was in the [place] clinic today for follow-up regarding coronary disease, pacemaker, and paroxysmal atrial fibrillation.  He is on his way down to [place] to pick up one of his sons from the airport.  1 of [name]'s grandsons is getting married this weekend.    From a cardiac standpoint he seems to be doing fine.  He is always have a little bit of breathlessness if he exerts himself too fast.  He is never really had typical chest discomfort and this remains the case.  He denies any problems with syncope or near syncope.  He is always had a little bit of a vagal syndrome at times.  He fell off a table a couple of months ago while changing a light bulb but that was most definitely a mechanical fall and nothing to do with lightheadedness.    His pacemaker has a battery longevity is down to less than a year and is too old for remote transmissions.  He is coming in on a monthly basis at this point for device checks.    He denies any TIA or stroke symptoms.  He has had no trouble with peripheral edema or other manifestations of fluid retention.         Vitals:    06/20/20 0840   BP: 128/74   BP Source: Arm, Left Upper   Patient Position: Sitting   Pulse: 81   SpO2: 99%   Weight: 85.7 kg (189 lb)   Height: 1.702 m (5' 7)     Body mass index is 29.6 kg/m?[name]     Past Medical History  Patient Active Problem List    Diagnosis Date Noted   ? Screening for cardiovascular condition 05/27/2017     11/2016 - Abdominal Duplex:  1.8 cm mid-abdominal aorta.  No aneurysm.     ? Left-sided weakness 04/05/2014   ? S/P drug eluting coronary stent placement 01/03/2014   ? PUD (peptic ulcer disease) 01/01/2014   ? 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  Non-contrast CT-head in [facility] ED negative.  03/2014 - Episode of left leg weakness.  Hospitalized overnight on Neurology service at [facility].  EEG, CT-head negative.  Attributed to focal seizure.  No new meds.     ? 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  Moderate right external carotid stenosis.  Normal vertebral arteries.    11/2016 - Carotid Duplex:  50-79% (133) RICA, 0-49% (102) LICA.       ? CAD (coronary artery disease) 09/10/2009     3/94 - Inferior MI.        3/94 - CABG x 4, [facility], Dr. Lennart Pall: LIMA-LAD, SSVG-2 sites of OMB,                  SVG-RCA.        10/98 - EXEC: EF 55%, mild anteroseptal hypokinesis improves with stress, non-ischemic.        4/99 - EXEC: EF 55%, non-ischemic study.        3/01 - EXEC: EF 55-60%, non-ischemic study.        5/02 - Cardiac cath, [place]: LIMA-LAD patent, SVG-RCA patent, sequential graft-OM1 and  OM2 patent, 50% plaque in graft to RCA (not obstructed), native RCA, LAD and LCX                   totally occluded, EF 50-55%        1/04 - Stress thallium (in-pt): EF 66%, low probability for exercise-induced ischemia.        1/06 - Cardiac cath: definite progression of CAD in SVG to RCA with focal 70-80%                   stenosis in mid portion of graft. Native RCA totally occluded. SVG to two Cx                   marginal branches widely patent, as was LIMA to LAD. LVEF 55%.  PCI/stent                   to SVG to RCA (bare metal stent).   12/2013 -    DES to native, distal RCA beyond the insertion of vein graft Joshua Keller).  Other finding:  SVG patent to the OM 1 and 2 with a new eccentric 60% to 70%                       stenosis in the distal graft just before the 1st anastomosis. Our recommendation is to manage this conservatively.     ? Hypertension 09/10/2009   ? Hyperlipidemia 09/10/2009     3/01 - Total 165, trig 139, HDL 39, LDL 98; Baycol 0.4mg .         1/02 - Total 228, trig 144, HDL 43, LDL 155; no meds.     ? 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     ? 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 Atrial fibrillation [facility]) 03/18/2007     12/03 [facility] with AF; Cardizem initiated with conversion to NSR.        1/04 - Transfer [place] d/t recurrence of AF and chest pressure. Cardizem d/c'd d/t                  bradycardia after conversion to NSR.         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 Toprol XL 100mg  initiated.     ? 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   HENT: Negative.    Eyes: Negative.    Cardiovascular: Negative.    Respiratory: Negative.    Endocrine: Negative.    Hematologic/Lymphatic: Negative.    Skin: Negative.    Musculoskeletal: Negative.    Gastrointestinal: Negative.    Genitourinary: Negative.    Neurological: Negative.    Psychiatric/Behavioral: Negative.    Allergic/Immunologic: Negative.        Physical Exam    Physical Exam   General Appearance: no distress   Skin: warm, no ulcers or xanthomas   Digits and Nails: no cyanosis or clubbing   Eyes: conjunctivae and lids normal, pupils are equal and round   Teeth/Gums/Palate: dentition unremarkable, no lesions   Lips & Oral Mucosa: no pallor or cyanosis   Neck Veins: normal JVP , neck veins are not distended   Thyroid: no nodules, masses, tenderness or enlargement  Chest Inspection: chest is normal in appearance   Respiratory Effort: breathing comfortably, no respiratory distress   Auscultation/Percussion: lungs clear to auscultation, no rales or rhonchi, no wheezing   PMI: PMI not enlarged or displaced   Cardiac Rhythm: regular rhythm and normal rate   Cardiac Auscultation: S1, S2 normal, no rub, no gallop   Murmurs: no murmur   Peripheral Circulation: normal peripheral circulation   Carotid Arteries: normal carotid upstroke bilaterally, no bruits   Radial Arteries: normal symmetric radial pulses   Abdominal Aorta: no abdominal aortic bruit   Pedal Pulses: normal symmetric pedal pulses   Lower Extremity Edema: no lower extremity edema   Abdominal Exam: soft, non-tender, no masses, bowel sounds normal   Liver & Spleen: no organomegaly   Gait & Station: walks without assistance   Muscle Strength: normal muscle tone   Orientation: oriented to time, place and person   Affect & Mood: appropriate and sustained affect   Language and Memory: patient responsive and seems to comprehend information   Neurologic Exam: neurological assessment grossly intact   Other: moves all extremities      Cardiovascular Studies    EKG:  A-paced rhythm, rate 81.  Intrinsic AV conduction with IRBBB.    Problems Addressed Today  Encounter Diagnoses   Name Primary?   ? Paroxysmal atrial fibrillation (HCC) Yes   ? Coronary artery disease involving native coronary artery of native heart without angina pectoris    ? Essential hypertension    ? Hyperlipidemia, unspecified hyperlipidemia type    ? Vasovagal syncope    ? Pacemaker        Assessment and Plan       Atrial fibrillation (HCC)  His device check does not show any recurrent atrial arrhythmia.  QT interval looks fine on sotalol.    CAD (coronary artery disease)  He has never had typical symptoms and I recommended surveillance stress testing intermittently.  We will plan a stress thallium when he is back in 6 months.    Hyperlipidemia  Lab Results   Component Value Date    CHOL 150 02/02/2020    TRIG 337 (H) 02/02/2020    HDL 36 (L) 02/02/2020    LDL 47 02/02/2020    VLDL 67 (H) 02/02/2020    NONHDLCHOL 122 04/05/2014    CHOLHDLC 4 02/02/2020          Hypertension  Blood pressure looks fine on the current medical program.    Vasovagal syncope  This seems to be quiescent currently.    Pacemaker  He will reach elective replacement indicator status within the next few months and is having monthly in office device checks because his device is too old for remote monitoring.      Current Medications (including today's revisions)  ? aspirin EC 81 mg tablet Take 1 Tab by mouth daily.   ? loratadine (CLARITIN) 10 mg PO tablet Take 10 mg by mouth Daily.   ? nitroglycerin (NITROSTAT) 0.4 mg tablet Place one tablet under tongue every 5 minutes as needed for Chest Pain.   ? omeprazole DR(+) (PRILOSEC) 20 mg capsule Take 20 mg by mouth as Needed.   ? prednisone (DELTASONE) 1 mg tablet Take 3-4 mg by mouth daily with breakfast.   ? simvastatin (ZOCOR) 80 mg tablet TAKE 1 TABLET BY MOUTH EVERY DAY   ? sotaloL (BETAPACE) 120 mg tab TAKE 1 TABLET BY MOUTH TWICE A DAY   ? tamsulosin (FLOMAX) 0.4 mg capsule Take 0.4  mg by mouth daily. Do not crush, chew or open capsules. Take 30 minutes following the same meal each day.     Total time spent on today's office visit was 30 minutes.  This includes face-to-face in person visit with patient as well as nonface-to-face time including review of the EMR, outside records, labs, radiologic studies, echocardiogram & other cardiovascular studies, formation of treatment plan, after visit summary, future disposition, and lastly on documentation.

## 2020-06-20 NOTE — Assessment & Plan Note
He will reach elective replacement indicator status within the next few months and is having monthly in office device checks because his device is too old for remote monitoring.

## 2020-06-20 NOTE — Assessment & Plan Note
Blood pressure looks fine on the current medical program.

## 2020-06-20 NOTE — Assessment & Plan Note
Lab Results   Component Value Date    CHOL 150 02/02/2020    TRIG 337 (H) 02/02/2020    HDL 36 (L) 02/02/2020    LDL 47 02/02/2020    VLDL 67 (H) 02/02/2020    NONHDLCHOL 122 04/05/2014    CHOLHDLC 4 02/02/2020

## 2020-07-02 ENCOUNTER — Encounter: Admit: 2020-07-02 | Discharge: 2020-07-02 | Payer: MEDICARE

## 2020-07-02 ENCOUNTER — Ambulatory Visit: Admit: 2020-07-02 | Discharge: 2020-07-02 | Payer: MEDICARE

## 2020-07-02 DIAGNOSIS — Z950 Presence of cardiac pacemaker: Secondary | ICD-10-CM

## 2020-07-03 ENCOUNTER — Encounter: Admit: 2020-07-03 | Discharge: 2020-07-03 | Payer: MEDICARE

## 2020-07-03 DIAGNOSIS — Z950 Presence of cardiac pacemaker: Secondary | ICD-10-CM

## 2020-07-03 DIAGNOSIS — R55 Syncope and collapse: Secondary | ICD-10-CM

## 2020-07-12 ENCOUNTER — Inpatient Hospital Stay: Admit: 2020-07-12 | Discharge: 2020-07-12 | Payer: MEDICARE

## 2020-07-12 ENCOUNTER — Encounter: Admit: 2020-07-12 | Discharge: 2020-07-12 | Payer: MEDICARE

## 2020-07-12 ENCOUNTER — Inpatient Hospital Stay: Admit: 2020-07-12 | Payer: MEDICARE

## 2020-07-12 DIAGNOSIS — R079 Chest pain, unspecified: Secondary | ICD-10-CM

## 2020-07-12 MED ORDER — SOTALOL 120 MG PO TAB
120 mg | Freq: Two times a day (BID) | ORAL | 0 refills | Status: DC
Start: 2020-07-12 — End: 2020-07-13

## 2020-07-12 MED ORDER — HEPARIN (PORCINE) IN 5 % DEX 20,000 UNIT/500 ML (40 UNIT/ML) IV SOLP
0-2000 [IU]/h | INTRAVENOUS | 0 refills | Status: DC
Start: 2020-07-12 — End: 2020-07-13
  Administered 2020-07-13: 02:00:00 1000 [IU]/h via INTRAVENOUS

## 2020-07-12 MED ORDER — PREDNISONE 1 MG PO TAB
3 mg | Freq: Every day | ORAL | 0 refills | Status: DC
Start: 2020-07-12 — End: 2020-07-13

## 2020-07-12 MED ORDER — TAMSULOSIN 0.4 MG PO CAP
0.4 mg | Freq: Every day | ORAL | 0 refills | Status: DC
Start: 2020-07-12 — End: 2020-07-15
  Administered 2020-07-13 – 2020-07-15 (×4): 0.4 mg via ORAL

## 2020-07-12 MED ORDER — PANTOPRAZOLE 20 MG PO TBEC
20 mg | Freq: Every day | ORAL | 0 refills | Status: DC
Start: 2020-07-12 — End: 2020-07-15
  Administered 2020-07-13 – 2020-07-15 (×3): 20 mg via ORAL

## 2020-07-12 MED ORDER — NITROGLYCERIN 0.4 MG SL SUBL
.4 mg | SUBLINGUAL | 0 refills | Status: DC | PRN
Start: 2020-07-12 — End: 2020-07-13
  Administered 2020-07-13: 0.4 mg via SUBLINGUAL

## 2020-07-12 MED ORDER — MORPHINE 2 MG/ML IV SYRG
2 mg | INTRAVENOUS | 0 refills | Status: DC | PRN
Start: 2020-07-12 — End: 2020-07-13

## 2020-07-12 MED ORDER — ASPIRIN 81 MG PO TBEC
81 mg | Freq: Every day | ORAL | 0 refills | Status: DC
Start: 2020-07-12 — End: 2020-07-13

## 2020-07-12 MED ORDER — ASPIRIN 325 MG PO TAB
325 mg | Freq: Every day | ORAL | 0 refills | Status: DC
Start: 2020-07-12 — End: 2020-07-13

## 2020-07-12 MED ORDER — ENOXAPARIN 40 MG/0.4 ML SC SYRG
40 mg | Freq: Every day | SUBCUTANEOUS | 0 refills | Status: DC
Start: 2020-07-12 — End: 2020-07-13

## 2020-07-12 MED ORDER — NITROGLYCERIN IN 5 % DEXTROSE 50 MG/250 ML (200 MCG/ML) IV SOLN
.1-1.5 ug/kg/min | INTRAVENOUS | 0 refills | Status: DC
Start: 2020-07-12 — End: 2020-07-13
  Administered 2020-07-13: 08:00:00 0.1 ug/kg/min via INTRAVENOUS

## 2020-07-12 MED ORDER — HEPARIN (PORCINE) BOLUS FOR CONTINUOUS INF (BAG)
20-40 [IU]/kg | INTRAVENOUS | 0 refills | Status: DC
Start: 2020-07-12 — End: 2020-07-13

## 2020-07-12 MED ORDER — MORPHINE 2 MG/ML IV SYRG
2 mg | Freq: Once | INTRAVENOUS | 0 refills | Status: CP
Start: 2020-07-12 — End: ?
  Administered 2020-07-13: 04:00:00 2 mg via INTRAVENOUS

## 2020-07-12 MED ORDER — MORPHINE 2 MG/ML IV SYRG
2 mg | INTRAVENOUS | 0 refills | Status: DC | PRN
Start: 2020-07-12 — End: 2020-07-14
  Administered 2020-07-13 (×2): 2 mg via INTRAVENOUS

## 2020-07-12 MED ORDER — NITROGLYCERIN 0.4 MG SL SUBL
.4 mg | SUBLINGUAL | 0 refills | Status: DC | PRN
Start: 2020-07-12 — End: 2020-07-13
  Administered 2020-07-13: 04:00:00 0.4 mg via SUBLINGUAL

## 2020-07-12 MED ORDER — ATORVASTATIN 40 MG PO TAB
80 mg | Freq: Every day | ORAL | 0 refills | Status: DC
Start: 2020-07-12 — End: 2020-07-13

## 2020-07-12 MED ORDER — ATORVASTATIN 40 MG PO TAB
80 mg | Freq: Every day | ORAL | 0 refills | Status: DC
Start: 2020-07-12 — End: 2020-07-15
  Administered 2020-07-13 – 2020-07-15 (×3): 80 mg via ORAL

## 2020-07-12 MED ORDER — SOTALOL 120 MG PO TAB
120 mg | Freq: Two times a day (BID) | ORAL | 0 refills | Status: DC
Start: 2020-07-12 — End: 2020-07-15
  Administered 2020-07-13 – 2020-07-15 (×5): 120 mg via ORAL

## 2020-07-12 MED ORDER — MAGNESIUM OXIDE 400 MG (241.3 MG MAGNESIUM) PO TAB
800 mg | Freq: Once | ORAL | 0 refills | Status: CP
Start: 2020-07-12 — End: ?
  Administered 2020-07-13: 05:00:00 800 mg via ORAL

## 2020-07-12 MED ORDER — ATORVASTATIN 40 MG PO TAB
40 mg | Freq: Every day | ORAL | 0 refills | Status: DC
Start: 2020-07-12 — End: 2020-07-13

## 2020-07-12 MED ORDER — HEPARIN (PORCINE) INITIAL BOLUS FOR CONTINUOUS INF (BAG)
4000 [IU] | Freq: Once | INTRAVENOUS | 0 refills | Status: CP
Start: 2020-07-12 — End: ?

## 2020-07-12 MED ORDER — PREDNISONE 1 MG PO TAB
3 mg | Freq: Every day | ORAL | 0 refills | Status: DC
Start: 2020-07-12 — End: 2020-07-15
  Administered 2020-07-13 – 2020-07-15 (×3): 3 mg via ORAL

## 2020-07-12 NOTE — Progress Notes
This patient needs to have a stress test before Tuesday.  If we bring him here, he could get one tomorrow.  Could we run this patient by IM for admission?

## 2020-07-12 NOTE — Progress Notes
Calls in, patient clinical reviewed.    1209 Connected with Dr. Charleston Ropes.  Chest pain radiating to the left arm.  Initial BP 180, down to 115  First trop negative we do have a bed we could keep this patient here tonight.  Dr. Maisie Fus Would keep this patient for OBS.  At very least he is an unstable angina.  Keep him, trend troponins, and then stress him.  It may take some time to leak troponin on this guy.  Dr. Charleston Ropes:  OK, We will admit him and watch his troponins and stress him.  Thank you.

## 2020-07-12 NOTE — Progress Notes
Patient presents this am with CP that began approx 2 hours prior to arrival, also had diaphoresis, no SOA or Nausea.  He had taken 1 NTG at home, was given 1 NTG in the Ed and Morphine 5 mg, to relieve the CP.  Patient is now pain free.  EKG is unremarkable.  CXR--> No acute process.  Troponin and BNP are pending.  CBC and Chemistry are unremarkable.  124 systolic  HR 81 RA,  No Concerns for COVID.  Requesting Consult with Cardiology.

## 2020-07-12 NOTE — Progress Notes
They do not have anyone to do stress this weekend.  All out with COVID.  How quickly does the stress need to be done?  Would Tuesday be ok, and keep the patient and monitor troponins?

## 2020-07-12 NOTE — H&P (View-Only)
Admission History and Physical Examination      Name:  Joshua Keller                                             MRN:  [id number]   Admission Date:  07/12/2020                     Assessment/Plan:    Principal Problem:    Chest pain    Joshua Keller is a 78-yaer-old male with history of HTN, HLD, Afib (pacemaker 2011), and CAD (CABGx4 1994 LIMA-LAD, SVG-OMBx2, SVG-RCA / PCI 2006), developed chest pain 8/6 AM. Transferred to [facility] from [facility] for stress test. Follows with Dr. Barry Dienes of Cardiology.    Unstable Angina  CAD  - Hx of CABG x4 1994: LIMA-LAD, SVG-OMBx2, SVG-RCA  - Hx PCI 2006  -Developed chest pain this morning, radiating into neck and both arms. Took one nitro with no relief and then had relief with nitrox1 and morphine at outside ED  ?EKG with no ischemic changes, troponin < 0.01, BNP 82, CXR with no acute changes  ?Transferred to [place] as outside hospital has no stress ability  - redeveloped chest pain after transferred to [place]  Plan  > trend troponins  > repeat EKG  > NPO at midnight for planned stress in AM  > Cardiology consulted, appreciate recs  > heparin gtt  > nitro PRN    Hx Afib  Implantable Pacemaker  - patient on sotalol for maintenance therapy  - patient in atrial paced rhythm on outside EKG  > continue PTA sotalol  > EKG as above    HLD  - on simvastatin 80mg  PTA  > start atorvastatin 40mg     HTN  - does not appear to be on antihypertensives  > CTM    Rheumatoid arthritis  - patient reportedly takes prednisone 3-4 mg daily for RA  > continue PTA prednisone 3mg  daily      FEN: no IVF, replace lytes PRN, NPO at midnight  Ppx: heparin gtt  Code: full, will discuss with patient  Dispo: admit to [facility]    Patient discussed with Dr. Nada Boozer, MD   Anesthesiology, PGY-1  Available on Amie Critchley  [phone]  __________________________________________________________________________________  Primary Care Physician: Wilford Grist  PCP Unknown Chief Complaint:  Chest pain  History of Present Illness: Joshua Keller is a 79 y.o. male patient states he was lightheaded and slightly diaphoretic this morning around 8:30 AM.  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  He took 1 nitro that he carries with him with no relief.  He then went to the local ED and received 1 more dose of nitro and morphine with relief of pain.  Per outside records EKG was atrial paced, chest x-ray with no acute abnormality, troponin <0.01, BNP 82, no other significant abnormalities.  Was transferred to the [facility] for planned stress in the morning.  Upon arrival patient asymptomatic, however developed chest pain similar to earlier soon after arrival.  Denies shortness of breath, recent fevers or chills, peripheral edema, nausea, vomiting, abdominal pain, or any  other complaints at this time.  Patient does admit that he is very anxious about this.  All questions answered at this time.    Medical History:   Diagnosis Date   ? Atrial fibrillation (HCC) 03/18/2007   ? CAD (coronary artery disease) 09/10/2009   ? Hyperlipidemia 09/10/2009   ? Hypertension 09/10/2009     Surgical History:   Procedure Laterality Date   ? BACK SURGERY       Family History   Problem Relation Age of Onset   ? Sudden Cardiac Death Father    ? Heart Attack Father    ? Heart Failure Brother      Social History     Socioeconomic History   ? Marital status: Married     Spouse name: Not on file   ? Number of children: Not on file   ? Years of education: Not on file   ? Highest education level: Not on file   Occupational History   ? Not on file   Tobacco Use   ? Smoking status: Never Smoker   ? Smokeless tobacco: Never Used   Substance and Sexual Activity   ? Alcohol use: Yes     Comment: Very little   ? Drug use: No   ? Sexual activity: Not on file   Other Topics Concern   ? Not on file   Social History Narrative   ? Not on file     Social Determinants of Health     Financial Resource Strain:    ? Difficulty of Paying Living Expenses:    Food Insecurity:    ? Worried About Programme researcher, broadcasting/film/video in the Last Year:    ? Barista in the Last Year:    Transportation Needs:    ? Freight forwarder (Medical):    ? Lack of Transportation (Non-Medical):    Physical Activity:    ? Days of Exercise per Week:    ? Minutes of Exercise per Session:    Stress:    ? Feeling of Stress :    Social Connections:    ? Frequency of Communication with Friends and Family:    ? Frequency of Social Gatherings with Friends and Family:    ? Attends Religious Services:    ? Active Member of Clubs or Organizations:    ? Attends Banker Meetings:    ? Marital Status:    Intimate Partner Violence:    ? Fear of Current or Ex-Partner:    ? Emotionally Abused:    ? Physically Abused:    ? Sexually Abused:                     Immunizations (includes history and patient reported):   There is no immunization history on file for this patient.        Allergies:  Sulfamethoxazole, Sulfate salt, Trimethoprim, and Chlorpromazine    Medications:  Medications Prior to Admission   Medication Sig   ? aspirin EC 81 mg tablet Take 1 Tab by mouth daily.   ? loratadine (CLARITIN) 10 mg PO tablet Take 10 mg by mouth Daily.   ? nitroglycerin (NITROSTAT) 0.4 mg tablet Place one tablet under tongue every 5 minutes as needed for Chest Pain.   ? omeprazole DR(+) (PRILOSEC) 20 mg capsule Take 20 mg by mouth as Needed.   ? prednisone (DELTASONE) 1 mg tablet Take 3-4 mg by mouth daily with breakfast.   ?  simvastatin (ZOCOR) 80 mg tablet TAKE 1 TABLET BY MOUTH EVERY DAY   ? sotaloL (BETAPACE) 120 mg tab TAKE 1 TABLET BY MOUTH TWICE A DAY   ? tamsulosin (FLOMAX) 0.4 mg capsule Take 0.4 mg by mouth daily. Do not crush, chew or open capsules. Take 30 minutes following the same meal each day.     Review of Systems:  Review of Systems   Constitutional: Negative for chills and fever.         Physical Exam:  Vital Signs: Last Filed In 24 Hours Vital Signs: 24 Hour Range   BP: 141/91 (08/06 1906)  Temp: 36.3 ?C (97.3 ?F) (08/06 1825)  Pulse: 80 (08/06 1906)  Respirations: 18 PER MINUTE (08/06 1825)  SpO2: 97 % (08/06 1906) BP: (116-141)/(79-91)   Temp:  [36.3 ?C (97.3 ?F)-36.7 ?C (98.1 ?F)]   Pulse:  [80-82]   Respirations:  [18 PER MINUTE]   SpO2:  [95 %-98 %]    Intensity Pain Scale (Self Report): 3 (07/12/20 1825)      General:  Alert, cooperative, no distress, appears stated age  Head:  Normocephalic, without obvious abnormality, atraumatic  Eyes:  EOMI, anicteric sclerae  Neck:  supple, trachea midline, no JVD  Lungs:  Clear to auscultation bilaterally  Chest wall:  No tenderness or deformity.  Heart:    Regular rate and rhythm, S1, S2 normal, no murmur, click rub or gallop  Abdomen:  Soft, non-tender.  Bowel sounds normal.  No masses.  No organomegaly.  Extremities:  Extremities normal, atraumatic, no cyanosis or edema  Peripheral pulses:   2+ and symmetric, all extremities  Cap Refill:  <2 seconds  Skin:   Skin color, texture, turgor normal.  No rashes or lesions  Neurologic:  A/Ox4, normal speech, normal strength, normal sensation    Lab/Radiology/Other Diagnostic Tests:  24-hour labs:  No results found for this visit on 07/12/20 (from the past 24 hour(s)).     Pertinent radiology reviewed., EKG Reviewed

## 2020-07-13 ENCOUNTER — Encounter: Admit: 2020-07-13 | Discharge: 2020-07-13 | Payer: MEDICARE

## 2020-07-13 ENCOUNTER — Inpatient Hospital Stay: Admit: 2020-07-13 | Discharge: 2020-07-13 | Payer: MEDICARE

## 2020-07-13 LAB — COVID-19 (SARS-COV-2) PCR

## 2020-07-13 LAB — PHOSPHORUS: Lab: 3.4 mg/dL (ref 2.0–4.5)

## 2020-07-13 LAB — LIPID PROFILE
Lab: 106 mg/dL
Lab: 139 mg/dL (ref <200)
Lab: 208 mg/dL — ABNORMAL HIGH (ref <150)
Lab: 33 mg/dL — ABNORMAL LOW (ref >40)

## 2020-07-13 LAB — POTASSIUM: Lab: 4.2 MMOL/L (ref >60)

## 2020-07-13 LAB — TROPONIN-I: Lab: 0.5 ng/mL — ABNORMAL HIGH (ref 0.0–0.05)

## 2020-07-13 LAB — CREATININE: Lab: 1.1 mg/dL (ref 0.4–1.24)

## 2020-07-13 LAB — MAGNESIUM: Lab: 1.9 mg/dL (ref >60)

## 2020-07-13 LAB — PTT (APTT): Lab: 21 s — ABNORMAL LOW (ref 24.0–36.5)

## 2020-07-13 MED ORDER — TICAGRELOR 90 MG PO TAB
90 mg | Freq: Two times a day (BID) | ORAL | 0 refills | Status: DC
Start: 2020-07-13 — End: 2020-07-15
  Administered 2020-07-14 – 2020-07-15 (×3): 90 mg via ORAL

## 2020-07-13 MED ORDER — METOPROLOL TARTRATE 25 MG PO TAB
12.5 mg | Freq: Two times a day (BID) | ORAL | 0 refills | Status: DC
Start: 2020-07-13 — End: 2020-07-14
  Administered 2020-07-13 – 2020-07-14 (×2): 12.5 mg via ORAL

## 2020-07-13 MED ORDER — ASPIRIN 81 MG PO CHEW
324 mg | Freq: Once | ORAL | 0 refills | Status: CP
Start: 2020-07-13 — End: ?

## 2020-07-13 MED ORDER — HYDROXYZINE HCL 10 MG/5 ML PO SOLN
25 mg | Freq: Once | ORAL | 0 refills | Status: DC
Start: 2020-07-13 — End: 2020-07-13

## 2020-07-13 MED ORDER — ASPIRIN 81 MG PO CHEW
81 mg | Freq: Every day | ORAL | 0 refills | Status: DC
Start: 2020-07-13 — End: 2020-07-15
  Administered 2020-07-14 – 2020-07-15 (×2): 81 mg via ORAL

## 2020-07-13 MED ORDER — DIPHENHYDRAMINE HCL 50 MG/ML IJ SOLN
25 mg | INTRAVENOUS | 0 refills | Status: DC | PRN
Start: 2020-07-13 — End: 2020-07-14

## 2020-07-13 MED ORDER — GUAIFENESIN 100 MG/5 ML PO LIQD
200 mg | ORAL | 0 refills | Status: DC | PRN
Start: 2020-07-13 — End: 2020-07-15

## 2020-07-13 MED ORDER — DIPHENHYDRAMINE HCL 25 MG PO CAP
25 mg | ORAL | 0 refills | Status: DC | PRN
Start: 2020-07-13 — End: 2020-07-14

## 2020-07-13 MED ORDER — ENOXAPARIN 40 MG/0.4 ML SC SYRG
40 mg | Freq: Every day | SUBCUTANEOUS | 0 refills | Status: DC
Start: 2020-07-13 — End: 2020-07-15

## 2020-07-13 MED ORDER — TRIMETHOBENZAMIDE 100 MG/ML IM SOLN
200 mg | INTRAMUSCULAR | 0 refills | Status: DC | PRN
Start: 2020-07-13 — End: 2020-07-15
  Administered 2020-07-13: 18:00:00 200 mg via INTRAMUSCULAR

## 2020-07-13 MED ORDER — HYDROXYZINE HCL 25 MG PO TAB
25 mg | Freq: Once | ORAL | 0 refills | Status: CP
Start: 2020-07-13 — End: ?
  Administered 2020-07-13: 25 mg via ORAL

## 2020-07-13 MED ORDER — ONDANSETRON HCL (PF) 4 MG/2 ML IJ SOLN
4 mg | INTRAVENOUS | 0 refills | Status: DC | PRN
Start: 2020-07-13 — End: 2020-07-13

## 2020-07-13 MED ORDER — SODIUM CHLORIDE 0.9 % IV SOLP
INTRAVENOUS | 0 refills | Status: AC
Start: 2020-07-13 — End: ?

## 2020-07-13 MED ORDER — TRAMADOL 50 MG PO TAB
50 mg | Freq: Once | ORAL | 0 refills | Status: CP
Start: 2020-07-13 — End: ?
  Administered 2020-07-13: 22:00:00 50 mg via ORAL

## 2020-07-13 MED ORDER — PERFLUTREN LIPID MICROSPHERES 1.1 MG/ML IV SUSP
1-20 mL | Freq: Once | INTRAVENOUS | 0 refills | Status: CP | PRN
Start: 2020-07-13 — End: ?
  Administered 2020-07-13: 19:00:00 2 mL via INTRAVENOUS

## 2020-07-13 MED ORDER — HEPARIN, PORCINE (PF) 100 UNIT/ML IV SYRG
500 [IU] | Freq: Once | 0 refills | Status: DC
Start: 2020-07-13 — End: 2020-07-13

## 2020-07-13 MED ORDER — HYDROXYZINE HCL 25 MG PO TAB
25 mg | Freq: Once | ORAL | 0 refills | Status: CP
Start: 2020-07-13 — End: ?
  Administered 2020-07-14: 03:00:00 25 mg via ORAL

## 2020-07-13 NOTE — Progress Notes
RT Adult Assessment Note    NAME:Rannie B Lorentz             MRN: [id number]             DOB:Jan 18, 1941          AGE: 79 y.o.  ADMISSION DATE: 07/12/2020             DAYS ADMITTED: LOS: 0 days    RT Treatment Plan:       Protocol Plan: Procedures  PAP: Place a nursing order for IS Q1h While Awake for any of Lung Expansion indicators  Oxygen/Humidity: O2 to keep SpO2 > 92%, if not on any RT modality, D/C protocol if greater than 24 hours on room air  SpO2: BID & PRN    Additional Comments:  Impressions of the patient: alert, oriented, non labored  Intervention(s)/outcome(s): completed RT protocol evaluation  Patient education that was completed: none  Recommendations to the care team:     Vital Signs:  Pulse: 81  RR: 16 PER MINUTE  SpO2: 97 %  O2 Device: Cannula  Liter Flow: 2 Lpm  O2%:    Breath Sounds: Clear (Implies normal);Decreased  Respiratory Effort: Non-Labored

## 2020-07-14 ENCOUNTER — Encounter: Admit: 2020-07-14 | Discharge: 2020-07-14 | Payer: MEDICARE

## 2020-07-14 MED ORDER — ACETAMINOPHEN 325 MG PO TAB
650 mg | ORAL | 0 refills | Status: DC | PRN
Start: 2020-07-14 — End: 2020-07-15

## 2020-07-14 MED ORDER — HYDROXYZINE HCL 25 MG PO TAB
25 mg | Freq: Once | ORAL | 0 refills | Status: CP
Start: 2020-07-14 — End: ?
  Administered 2020-07-14: 19:00:00 25 mg via ORAL

## 2020-07-15 ENCOUNTER — Encounter: Admit: 2020-07-15 | Discharge: 2020-07-15 | Payer: MEDICARE

## 2020-07-15 ENCOUNTER — Inpatient Hospital Stay: Admit: 2020-07-15 | Discharge: 2020-07-15 | Payer: MEDICARE

## 2020-07-15 DIAGNOSIS — E785 Hyperlipidemia, unspecified: Secondary | ICD-10-CM

## 2020-07-15 DIAGNOSIS — I2510 Atherosclerotic heart disease of native coronary artery without angina pectoris: Secondary | ICD-10-CM

## 2020-07-15 DIAGNOSIS — I10 Essential (primary) hypertension: Secondary | ICD-10-CM

## 2020-07-15 DIAGNOSIS — I4891 Unspecified atrial fibrillation: Secondary | ICD-10-CM

## 2020-07-15 MED ORDER — CLOPIDOGREL 75 MG PO TAB
75 mg | Freq: Every day | ORAL | 0 refills | Status: DC
Start: 2020-07-15 — End: 2020-07-15

## 2020-07-15 MED ORDER — HYDROXYZINE HCL 25 MG PO TAB
25 mg | Freq: Three times a day (TID) | ORAL | 0 refills | Status: DC | PRN
Start: 2020-07-15 — End: 2020-07-15

## 2020-07-15 MED ORDER — CLOPIDOGREL 300 MG PO TAB
300 mg | Freq: Once | ORAL | 0 refills | Status: CP
Start: 2020-07-15 — End: ?
  Administered 2020-07-15: 14:00:00 300 mg via ORAL

## 2020-07-15 MED ORDER — CLOPIDOGREL 75 MG PO TAB
75 mg | ORAL_TABLET | Freq: Every day | ORAL | 0 refills | 90.00000 days | Status: AC
Start: 2020-07-15 — End: ?
  Filled 2020-07-15: qty 90, 90d supply, fill #1

## 2020-07-15 MED ORDER — PANTOPRAZOLE 20 MG PO TBEC
20 mg | ORAL_TABLET | Freq: Every day | ORAL | 0 refills | 90.00000 days | Status: AC
Start: 2020-07-15 — End: ?
  Filled 2020-07-15: qty 60, 60d supply, fill #1

## 2020-07-15 MED ORDER — TICAGRELOR 90 MG PO TAB
90 mg | ORAL_TABLET | Freq: Two times a day (BID) | ORAL | 0 refills | Status: DC
Start: 2020-07-15 — End: 2020-07-15

## 2020-07-15 MED ORDER — ATORVASTATIN 80 MG PO TAB
80 mg | ORAL_TABLET | Freq: Every day | ORAL | 0 refills | Status: AC
Start: 2020-07-15 — End: ?
  Filled 2020-07-15: qty 90, 90d supply, fill #1

## 2020-07-15 MED ORDER — HYDROXYZINE HCL 25 MG PO TAB
25 mg | Freq: Three times a day (TID) | ORAL | 0 refills | Status: DC | PRN
Start: 2020-07-15 — End: 2020-07-15
  Administered 2020-07-15: 11:00:00 25 mg via ORAL

## 2020-07-15 MED ORDER — HYDROXYZINE HCL 25 MG PO TAB
25 mg | ORAL_TABLET | Freq: Three times a day (TID) | ORAL | 0 refills | 30.00000 days | Status: AC | PRN
Start: 2020-07-15 — End: ?
  Filled 2020-07-15: qty 30, 10d supply, fill #1

## 2020-07-26 ENCOUNTER — Encounter: Admit: 2020-07-26 | Discharge: 2020-07-26 | Payer: MEDICARE

## 2020-07-26 ENCOUNTER — Ambulatory Visit: Admit: 2020-07-26 | Discharge: 2020-07-26 | Payer: MEDICARE

## 2020-07-26 DIAGNOSIS — E785 Hyperlipidemia, unspecified: Secondary | ICD-10-CM

## 2020-07-26 DIAGNOSIS — I4891 Unspecified atrial fibrillation: Secondary | ICD-10-CM

## 2020-07-26 DIAGNOSIS — I480 Paroxysmal atrial fibrillation: Secondary | ICD-10-CM

## 2020-07-26 DIAGNOSIS — I2510 Atherosclerotic heart disease of native coronary artery without angina pectoris: Secondary | ICD-10-CM

## 2020-07-26 DIAGNOSIS — I10 Essential (primary) hypertension: Secondary | ICD-10-CM

## 2020-07-26 DIAGNOSIS — Z951 Presence of aortocoronary bypass graft: Secondary | ICD-10-CM

## 2020-07-26 MED ORDER — CLOPIDOGREL 75 MG PO TAB
75 mg | ORAL_TABLET | Freq: Every day | ORAL | 3 refills | 90.00000 days | Status: AC
Start: 2020-07-26 — End: ?

## 2020-07-26 NOTE — Progress Notes
Date of Service: 07/26/2020    Joshua Keller is a 79 y.o. male.       HPI     Joshua Keller?is 78 y.o.?pleasant male?with past medical history significant for coronary artery disease (s/p CABG in 1994 in setting of inferior MI with LIMA to LAD, SVG with sequential grafting to OM1 and OM 2, SVG to RCA, s/p PCI with BMS to SVG-RCA in 2006, and DES to distal native RCA in 2015), paroxysmal atrial fibrillation on sotalol, status post permanent pacemaker, hypertension, and hyperlipidemia.    On August 6 patient developed left-sided chest heaviness radiating to his neck and [place], he took nitroglycerin which did not help.  He presented to the 2 emergency department in [place] and was given nitroglycerin and morphine with resolution of his chest pain.  Initial troponin was negative with no changes on EKG, patient was transferred to [place] for further cardiac work-up.  Later that night at [place], patient developed chest pain and initial troponin at [place] was 0.5.  Heparin drip and nitro drip were started and cardiology was consulted.  Troponin continued to climb and patient was taken for cardiac cath on August 7 which showed 80 to 90% stenosis of the mid saphenous vein graft to the OM1, OM 2 with stent placement.  Echo on August 7 also showed EF of 55%, no valvular abnormality, and wall motion abnormality consistent with postoperative state.    He was initiated on aspirin and Plavix for dual antiplatelet therapy.  He returns today for follow-up after recent hospital admission and NSTEMI.    Denies any further chest pain since hospital admission.  He denies progressive shortness of breath, orthopnea, PND, or lower extremity edema.  He has been without recent palpitations, dizziness, lightheadedness, or near syncope.  He started cardiac rehab a few days ago and has been tolerating well without any significant complaints.  He has been compliant with dual antiplatelet therapy and denies bleeding complications related to this.  He denies right groin access site complications.  He reports blood pressures at home have been well controlled with systolics in the 130s and diastolics in the 70s.         Vitals:    07/26/20 1432   BP: 132/72   BP Source: Arm, Right Upper   Patient Position: Sitting   Pulse: 81   SpO2: 96%   Weight: 85.8 kg (189 lb 3.2 oz)   Height: 1.702 m (5' 7)   PainSc: Zero     Body mass index is 29.63 kg/m?[name]     Past Medical History  Patient Active Problem List    Diagnosis Date Noted   ? Screening for cardiovascular condition 05/27/2017     11/2016 - Abdominal Duplex:  1.8 cm mid-abdominal aorta.  No aneurysm.     ? Left-sided weakness 04/05/2014   ? S/P drug eluting coronary stent placement 01/03/2014   ? PUD (peptic ulcer disease) 01/01/2014   ? 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  Non-contrast CT-head in [facility] ED negative.  03/2014 - Episode of left leg weakness.  Hospitalized overnight on Neurology service at [facility].  EEG, CT-head negative.  Attributed to focal seizure.  No new meds.     ? 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  Moderate right external carotid stenosis.  Normal vertebral  arteries.    11/2016 - Carotid Duplex:  50-79% (133) RICA, 0-49% (102) LICA.       ? CAD (coronary artery disease) 09/10/2009     3/94 - Inferior MI.        3/94 - CABG x 4, [facility], Dr. Lennart Pall: LIMA-LAD, SSVG-2 sites of OMB,                  SVG-RCA.        10/98 - EXEC: EF 55%, mild anteroseptal hypokinesis improves with stress, non-ischemic.        4/99 - EXEC: EF 55%, non-ischemic study.        3/01 - EXEC: EF 55-60%, non-ischemic study.        5/02 - Cardiac cath, [place]: LIMA-LAD patent, SVG-RCA patent, sequential graft-OM1 and                  OM2 patent, 50% plaque in graft to RCA (not obstructed), native RCA, LAD and LCX totally occluded, EF 50-55%        1/04 - Stress thallium (in-pt): EF 66%, low probability for exercise-induced ischemia.        1/06 - Cardiac cath: definite progression of CAD in SVG to RCA with focal 70-80%                   stenosis in mid portion of graft. Native RCA totally occluded. SVG to two Cx                   marginal branches widely patent, as was LIMA to LAD. LVEF 55%.  PCI/stent                   to SVG to RCA (bare metal stent).   12/2013 -    DES to native, distal RCA beyond the insertion of vein graft [name]).  Other finding:  SVG patent to the OM 1 and 2 with a new eccentric 60% to 70%                       stenosis in the distal graft just before the 1st anastomosis. Our recommendation is to manage this conservatively.     ? Hypertension 09/10/2009   ? Hyperlipidemia 09/10/2009     3/01 - Total 165, trig 139, HDL 39, LDL 98; Baycol 0.4mg .         1/02 - Total 228, trig 144, HDL 43, LDL 155; no meds.     ? 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     ? 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 Atrial fibrillation [facility]) 03/18/2007     12/03 [facility] with AF; Cardizem initiated with conversion to NSR.        1/04 - Transfer [place] d/t recurrence of AF and chest pressure. Cardizem d/c'd d/t                  bradycardia after conversion to NSR.         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 Toprol XL 100mg  initiated.     ? 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   HENT: Negative.    Eyes: Negative.    Cardiovascular:  Negative.    Respiratory: Negative.    Endocrine: Negative.    Hematologic/Lymphatic: Negative.    Skin: Negative.    Musculoskeletal: Negative.    Gastrointestinal: Negative.    Genitourinary: Negative.    Neurological: Negative.    Psychiatric/Behavioral: Negative.    Allergic/Immunologic: Negative.        Physical Exam  General Appearance: no acute distress  Skin: warm & intact  HEENT: unremarkable  Neck Veins: neck veins are flat & not distended  Carotid Arteries: no bruits  Chest Inspection: chest is normal in appearance  Auscultation/Percussion: lungs clear to auscultation, no rales, rhonchi, or wheezing  Cardiac Rhythm: regular rhythm & normal rate  Cardiac Auscultation: Normal S1 & S2, no S3 or S4, no rub  Murmurs: no cardiac murmurs   Extremities: no lower extremity edema; 2+ symmetric distal pulses  Abdominal Exam: soft, non-tender, no masses, bowel sounds normal  Liver & Spleen: no organomegaly  Neurologic Exam: oriented to time, place and person; no focal neurologic deficits  Psychiatric: Normal mood and affect.  Behavior is normal. Judgment and thought content normal.       Cardiovascular Studies      Problems Addressed Today  Encounter Diagnoses   Name Primary?   ? S/P CABG x 4 Yes   ? Coronary artery disease involving native coronary artery of native heart without angina pectoris    ? Paroxysmal atrial fibrillation (HCC)    ? Essential hypertension    ? Hyperlipidemia, unspecified hyperlipidemia type        Assessment and Plan     Coronary artery disease/NSTEMI  Recent admission for NSTEMI and cath revealed 80 to 90% stenosis of the mid saphenous vein graft to the obtuse marginal 1 and obtuse marginal 2 and he underwent successful drug-eluting stent placement.  He will plan to continue Plavix for at least 1 year and aspirin indefinitely.  He is participating in cardiac rehab in [place] close to home.    Paroxysmal atrial fibrillation  He was continued on PTA sotalol.  He is not on long-term oral anticoagulation.  We will continue aspirin.    Dyslipidemia  He was initiated on high intensity atorvastatin for LDL goal less than 70.  Lipid profile on 8/6 revealed LDL 76.  We will plan to repeat lipids in 3 months.    Essential hypertension  Pressure is optimally controlled.  He did not make any changes in his regimen today.    I appreciate the opportunity to participate in his care.  He has scheduled follow-up with Dr. Barry Dienes in the [place] clinic in November.    Dorena Cookey, APRN  Pager [phone]       Total Time Today was 45 minutes in the following activities: Preparing to see the patient, Obtaining and/or reviewing separately obtained history, Performing a medically appropriate examination and/or evaluation, Counseling and educating the patient/family/caregiver, Ordering medications, tests, or procedures, Referring and communication with other health care professionals (when not separately reported), Documenting clinical information in the electronic or other health record and Care coordination (not separately reported)      Current Medications (including today's revisions)  ? acetaminophen/diphenhydramine (TYLENOL PM PO) Take 325 mg by mouth at bedtime daily.   ? aspirin EC 81 mg tablet Take 1 Tab by mouth daily.   ? atorvastatin (LIPITOR) 80 mg tablet Take one tablet by mouth daily.   ? clopiDOGrel (PLAVIX) 75 mg tablet Take one tablet by mouth daily.   ? hydrOXYzine HCL (ATARAX) 25 mg  tablet Take one tablet by mouth three times daily as needed. Indications: anxious   ? loratadine (CLARITIN) 10 mg PO tablet Take 10 mg by mouth Daily.   ? nitroglycerin (NITROSTAT) 0.4 mg tablet Place one tablet under tongue every 5 minutes as needed for Chest Pain.   ? pantoprazole DR (PROTONIX) 20 mg tablet Take one tablet by mouth daily.   ? prednisone (DELTASONE) 1 mg tablet Take 3-4 mg by mouth daily with breakfast.   ? sotaloL (BETAPACE) 120 mg tab TAKE 1 TABLET BY MOUTH TWICE A DAY   ? tamsulosin (FLOMAX) 0.4 mg capsule Take 0.4 mg by mouth daily. Do not crush, chew or open capsules. Take 30 minutes following the same meal each day.

## 2020-07-26 NOTE — Patient Instructions
Continue Plavix for at least 1 year after your stent.  Continue aspirin indefinitely.    We will plan to recheck your cholesterol in 3 months.    Have follow-up scheduled with Dr. Barry Dienes in November.    Continue cardiac rehab as directed.

## 2020-07-30 ENCOUNTER — Encounter: Admit: 2020-07-30 | Discharge: 2020-07-30 | Payer: MEDICARE

## 2020-07-30 ENCOUNTER — Ambulatory Visit: Admit: 2020-07-30 | Discharge: 2020-07-30 | Payer: MEDICARE

## 2020-07-30 DIAGNOSIS — Z959 Presence of cardiac and vascular implant and graft, unspecified: Secondary | ICD-10-CM

## 2020-07-31 ENCOUNTER — Encounter: Admit: 2020-07-31 | Discharge: 2020-07-31 | Payer: MEDICARE

## 2020-09-03 ENCOUNTER — Encounter: Admit: 2020-09-03 | Discharge: 2020-09-03 | Payer: MEDICARE

## 2020-09-03 ENCOUNTER — Ambulatory Visit: Admit: 2020-09-03 | Discharge: 2020-09-03 | Payer: MEDICARE

## 2020-10-01 ENCOUNTER — Encounter: Admit: 2020-10-01 | Discharge: 2020-10-01 | Payer: MEDICARE

## 2020-10-07 ENCOUNTER — Encounter: Admit: 2020-10-07 | Discharge: 2020-10-07 | Payer: MEDICARE

## 2020-10-07 MED ORDER — ATORVASTATIN 80 MG PO TAB
80 mg | ORAL_TABLET | Freq: Every day | ORAL | 3 refills | Status: AC
Start: 2020-10-07 — End: ?

## 2020-10-23 ENCOUNTER — Encounter: Admit: 2020-10-23 | Discharge: 2020-10-23 | Payer: MEDICARE

## 2020-10-24 ENCOUNTER — Encounter: Admit: 2020-10-24 | Discharge: 2020-10-24 | Payer: MEDICARE

## 2020-10-25 ENCOUNTER — Ambulatory Visit: Admit: 2020-10-25 | Discharge: 2020-10-25 | Payer: MEDICARE

## 2020-10-25 ENCOUNTER — Encounter: Admit: 2020-10-25 | Discharge: 2020-10-25 | Payer: MEDICARE

## 2020-10-25 DIAGNOSIS — E785 Hyperlipidemia, unspecified: Secondary | ICD-10-CM

## 2020-10-25 DIAGNOSIS — I2510 Atherosclerotic heart disease of native coronary artery without angina pectoris: Secondary | ICD-10-CM

## 2020-10-25 DIAGNOSIS — Z950 Presence of cardiac pacemaker: Secondary | ICD-10-CM

## 2020-10-25 DIAGNOSIS — I10 Essential (primary) hypertension: Secondary | ICD-10-CM

## 2020-10-25 DIAGNOSIS — I4891 Unspecified atrial fibrillation: Secondary | ICD-10-CM

## 2020-10-25 MED ORDER — MIDODRINE 2.5 MG PO TAB
2.5 mg | ORAL_TABLET | ORAL | 3 refills | Status: AC
Start: 2020-10-25 — End: ?

## 2020-10-25 NOTE — Assessment & Plan Note
His pacemaker is functioning normally and he still has a little over 6 months expected battery longevity.

## 2020-10-25 NOTE — Assessment & Plan Note
Lab Results   Component Value Date    CHOL 139 07/12/2020    TRIG 208 (H) 07/12/2020    HDL 33 (L) 07/12/2020    LDL 76 07/12/2020    VLDL 42 07/12/2020    NONHDLCHOL 106 07/12/2020    CHOLHDLC 4 02/02/2020      LDL close to goal.

## 2020-10-25 NOTE — Progress Notes
Date of Service: 10/25/2020    Joshua Keller is a 79 y.o. male.       HPI     Joshua Keller was in the [place] clinic today with his wife.  He has been having some episodes of dizziness and lightheadedness related to changes in posture.  They are planning a trip down to [place] on "Sunday to visit with a bunch of family and he will be playing some golf down there.  They were a little concerned that there might be a problem with the pacemaker battery.    Joshua Keller had a remote transmission recently and his battery longevity is still projected to be 6 months or more.  He wears a wrist heart rate monitor and it is occasionally showing heart rate is lower than 80, the low rate set for his pacemaker.  I think this is a problem with the wearable device because the pacemaker check does not indicate any episodes of heart rate below 80.  I suspect that the wearable does not detect PVCs or PACs that may be occurring.    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  He is not on any other medications that would potentially lower blood pressure.    He denies any chest discomfort or particular changes in his exertional breathlessness.  He certainly has not had any syncope nor has he had palpitations.         Vitals:    10/25/20 0824 10/25/20 0830 10/25/20 0831 10/25/20 0832   BP: 132/78 134/86 128/80 124/76   BP Source: Arm, Left Upper Arm, Right Upper Arm, Right Upper Arm, Right Upper   Patient Position: Sitting Supine Sitting Standing   Pulse: 80 78 80 81   SpO2: 98%      Weight: 88.5 kg (195 lb)      Height: 1.702 m (5' 7)      PainSc: Zero        Body mass index is 30.54 kg/m?.     Past Medical History  Patient Active Problem List    Diagnosis Date Noted   ? Screening for cardiovascular condition 05/27/2017     12" /2017 - Abdominal Duplex:  1.8 cm mid-abdominal aorta.  No aneurysm.     ? Left-sided weakness 04/05/2014   ? S/P drug eluting coronary stent placement 01/03/2014   ? PUD (peptic ulcer disease) 01/01/2014   ? 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  Non-contrast CT-head in [facility] ED negative.  03/2014 - Episode of left leg weakness.  Hospitalized overnight on Neurology service at [facility].  EEG, CT-head negative.  Attributed to focal seizure.  No new meds.     ? 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  Moderate right external carotid stenosis.  Normal vertebral arteries.    11/2016 - Carotid Duplex:  50-79% (133) RICA, 0-49% (102) LICA.       ? CAD (coronary artery disease) 09/10/2009     3/94 - Inferior MI.        3/94 - CABG x 4, [facility], Dr. Lennart Pall: LIMA-LAD, SSVG-2 sites of OMB,  SVG-RCA.        10/98 - EXEC: EF 55%, mild anteroseptal hypokinesis improves with stress, non-ischemic.        4/99 - EXEC: EF 55%, non-ischemic study.        3/01 - EXEC: EF 55-60%, non-ischemic study.        5/02 - Cardiac cath, [place]: LIMA-LAD patent, SVG-RCA patent, sequential graft-OM1 and                  OM2 patent, 50% plaque in graft to RCA (not obstructed), native RCA, LAD and LCX                   totally occluded, EF 50-55%        1/04 - Stress thallium (in-pt): EF 66%, low probability for exercise-induced ischemia.        1/06 - Cardiac cath: definite progression of CAD in SVG to RCA with focal 70-80%                   stenosis in mid portion of graft. Native RCA totally occluded. SVG to two Cx                   marginal branches widely patent, as was LIMA to LAD. LVEF 55%.  PCI/stent                   to SVG to RCA (bare metal stent).   12/2013 -    DES to native, distal RCA beyond the insertion of vein graft [name]).  Other finding:  SVG patent to the OM 1 and 2 with a new eccentric 60% to 70%                       stenosis in the distal graft just before the 1st anastomosis. Our recommendation is to manage this conservatively.  07/13/20 Echo: Left Ventricle: EF 55%. The left ventricular size is normal. Concentric remodeling. There are segmental wall motion abnormalities, as coded in the diagram below (new compared to 03/30/2013 study). Abnormal septal motion consistent with post-operative state. Left ventricular diastolic dysfunction. Right Ventricle: The right ventricular size is normal. The right ventricular systolic function is mildly reduced. No hemodynamically significant valvular abnormalities.  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 80% to 90% stenosis in the mid saphenous vein graft in sequence to obtuse marginal 1 and obtuse marginal 2, which was thought to be the culprit lesion, with placement of a 4.0 x 28 mm Xience Skypoint stent that was post dilated with a 4.5 NC balloon. Patent LIMA to LAD graft, patent saphenous vein graft to right coronary artery graft with a patent stent in its midportion. Patent saphenous vein graft-obtuse marginal graft, status post percutaneous coronary intervention to the 80% to 90% stenosis in its mid segment. Low left ventricular end-diastolic pressure.       ? Hypertension 09/10/2009   ? Hyperlipidemia 09/10/2009     3/01 - Total 165, trig 139, HDL 39, LDL 98; Baycol 0.4mg .         1/02 - Total 228, trig 144, HDL 43, LDL 155; no meds.     ? 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     ? 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 Atrial fibrillation [facility]) 03/18/2007     12/03 [facility] with AF; Cardizem initiated with conversion to NSR.  1/04 - Transfer [place] d/t recurrence of AF and chest pressure. Cardizem d/c'd d/t                  bradycardia after conversion to NSR.         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 Toprol XL 100mg  initiated.     ? 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   HENT: Negative.    Eyes: Negative.    Cardiovascular: Negative.    Respiratory: Negative.    Endocrine: Negative.    Hematologic/Lymphatic: Negative.    Skin: Negative.    Musculoskeletal: Negative.    Gastrointestinal: Negative.    Genitourinary: Negative.    Neurological: Positive for dizziness and light-headedness.   Psychiatric/Behavioral: Negative.    Allergic/Immunologic: Negative.        Physical Exam    Physical Exam   General Appearance: no distress   Skin: warm, no ulcers or xanthomas   Digits and Nails: no cyanosis or clubbing   Eyes: conjunctivae and lids normal, pupils are equal and round   Teeth/Gums/Palate: dentition unremarkable, no lesions   Lips & Oral Mucosa: no pallor or cyanosis   Neck Veins: normal JVP , neck veins are not distended   Thyroid: no nodules, masses, tenderness or enlargement   Chest Inspection: chest is normal in appearance   Respiratory Effort: breathing comfortably, no respiratory distress   Auscultation/Percussion: lungs clear to auscultation, no rales or rhonchi, no wheezing   PMI: PMI not enlarged or displaced   Cardiac Rhythm: regular rhythm and normal rate   Cardiac Auscultation: S1, S2 normal, no rub, no gallop   Murmurs: no murmur   Peripheral Circulation: normal peripheral circulation   Carotid Arteries: normal carotid upstroke bilaterally, no bruits   Radial Arteries: normal symmetric radial pulses   Abdominal Aorta: no abdominal aortic bruit   Pedal Pulses: normal symmetric pedal pulses   Lower Extremity Edema: no lower extremity edema   Abdominal Exam: soft, non-tender, no masses, bowel sounds normal   Liver & Spleen: no organomegaly   Gait & Station: walks without assistance   Muscle Strength: normal muscle tone   Orientation: oriented to time, place and person   Affect & Mood: appropriate and sustained affect Language and Memory: patient responsive and seems to comprehend information   Neurologic Exam: neurological assessment grossly intact   Other: moves all extremities      Cardiovascular Health Factors  Vitals BP Readings from Last 3 Encounters:   10/25/20 124/76   07/26/20 132/72   07/15/20 102/56     Wt Readings from Last 3 Encounters:   10/25/20 88.5 kg (195 lb)   07/26/20 85.8 kg (189 lb 3.2 oz)   07/13/20 85.2 kg (187 lb 13.3 oz)     BMI Readings from Last 3 Encounters:   10/25/20 30.54 kg/m?   07/26/20 29.63 kg/m?   07/13/20 29.42 kg/m?      Smoking Social History     Tobacco Use   Smoking Status Never Smoker   Smokeless Tobacco Never Used      Lipid Profile Cholesterol   Date Value Ref Range Status   07/12/2020 139 <200 MG/DL Final     HDL   Date Value Ref Range Status   07/12/2020 33 (L) >40 MG/DL  Final     LDL   Date Value Ref Range Status   07/12/2020 76 <100 mg/dL Final     Triglycerides   Date Value Ref Range Status   07/12/2020 208 (H) <150 MG/DL Final      Blood Sugar Hemoglobin A1C   Date Value Ref Range Status   07/13/2020 5.8 4.0 - 6.0 % Final     Comment:     The ADA recommends that most patients with type 1 and type 2 diabetes maintain   an A1c level <7%.       Glucose   Date Value Ref Range Status   07/15/2020 101 (H) 70 - 100 MG/DL Final   16/09/9603 540 (H) 70 - 100 MG/DL Final   98/10/9146 829 (H) 70 - 100 MG/DL Final   56/21/3086 93 70 - 110 MG/DL Final   57/84/6962 952 70 - 110 MG/DL Final   84/13/2440 97 70 - 110 MG/DL Final     Glucose, POC   Date Value Ref Range Status   04/05/2014 101 (H) 70 - 100 MG/DL Final          Problems Addressed Today  Encounter Diagnoses   Name Primary?   ? Coronary artery disease involving native coronary artery of native heart without angina pectoris    ? Hyperlipidemia, unspecified hyperlipidemia type    ? Primary hypertension    ? Pacemaker        Assessment and Plan       CAD (coronary artery disease)  He has had no chest discomfort since undergoing coronary intervention in August.    Hyperlipidemia  Lab Results   Component Value Date    CHOL 139 07/12/2020    TRIG 208 (H) 07/12/2020    HDL 33 (L) 07/12/2020    LDL 76 07/12/2020    VLDL 42 07/12/2020    NONHDLCHOL 106 07/12/2020    CHOLHDLC 4 02/02/2020      LDL close to goal.    Hypertension  I suggested that he switch the Flomax dosing to the evening.  I also suggested that he take 1/4 teaspoon of salt and warm water every morning and that he will be sure to increase his fluid intake.  I gave him a prescription for midodrine 2.5 mg every 4 hours as needed during waking hours.    Pacemaker  His pacemaker is functioning normally and he still has a little over 6 months expected battery longevity.      Current Medications (including today's revisions)  ? acetaminophen/diphenhydramine (TYLENOL PM PO) Take 325 mg by mouth at bedtime daily.   ? aspirin EC 81 mg tablet Take 1 Tab by mouth daily.   ? atorvastatin (LIPITOR) 80 mg tablet Take one tablet by mouth daily.   ? clopiDOGrel (PLAVIX) 75 mg tablet Take one tablet by mouth daily.   ? hydrOXYzine HCL (ATARAX) 25 mg tablet Take one tablet by mouth three times daily as needed. Indications: anxious   ? loratadine (CLARITIN) 10 mg PO tablet Take 10 mg by mouth Daily.   ? midodrine (PROAMATINE) 2.5 mg tablet Take one tablet by mouth every 4 hours while awake. Indications: a feeling of dizziness upon standing due to a drop in blood pressure   ? nitroglycerin (NITROSTAT) 0.4 mg tablet Place one tablet under tongue every 5 minutes as needed for Chest Pain.   ? pantoprazole DR (PROTONIX) 20 mg tablet Take one tablet by mouth daily.   ? prednisone (DELTASONE) 1 mg  tablet Take 3-4 mg by mouth daily with breakfast.   ? sotaloL (BETAPACE) 120 mg tab TAKE 1 TABLET BY MOUTH TWICE A DAY   ? tamsulosin (FLOMAX) 0.4 mg capsule Take 0.4 mg by mouth daily. Do not crush, chew or open capsules. Take 30 minutes following the same meal each day.     Total time spent on today's office visit was 30 minutes.  This includes face-to-face in person visit with patient as well as nonface-to-face time including review of the EMR, outside records, labs, radiologic studies, echocardiogram & other cardiovascular studies, formation of treatment plan, after visit summary, future disposition, and lastly on documentation.

## 2020-10-25 NOTE — Assessment & Plan Note
He has had no chest discomfort since undergoing coronary intervention in August.

## 2020-10-25 NOTE — Assessment & Plan Note
I suggested that he switch the Flomax dosing to the evening.  I also suggested that he take 1/4 teaspoon of salt and warm water every morning and that he will be sure to increase his fluid intake.  I gave him a prescription for midodrine 2.5 mg every 4 hours as needed during waking hours.

## 2020-10-25 NOTE — Patient Instructions
1.  1/4 teaspoon of salt in warm water in the morning  2.  Liberalize sodium intake and increase fluid intake  3.  Midodrine 2.5 mg every 4 hours as needed while awake  4.  Take your Flomax in the evening

## 2020-11-26 ENCOUNTER — Encounter: Admit: 2020-11-26 | Discharge: 2020-11-26 | Payer: MEDICARE

## 2020-11-26 NOTE — Progress Notes
Patient is here due to episode of hypertension and dizziness onset this morning list pressures of 196/111 158/96 156/92 he took a total of 3 nitro to decrease bp.  He is better but still a little lightheaded.  He is due for device check in Jan as his device is nearing ERI.  Ekg reveals Atrial pace rhythm.  Under additional stress due to care for wife post procedure.

## 2020-11-28 ENCOUNTER — Encounter: Admit: 2020-11-28 | Discharge: 2020-11-28 | Payer: MEDICARE

## 2020-11-28 DIAGNOSIS — I10 Essential (primary) hypertension: Secondary | ICD-10-CM

## 2020-11-28 DIAGNOSIS — I480 Paroxysmal atrial fibrillation: Secondary | ICD-10-CM

## 2020-11-28 DIAGNOSIS — E785 Hyperlipidemia, unspecified: Secondary | ICD-10-CM

## 2020-11-28 DIAGNOSIS — I2510 Atherosclerotic heart disease of native coronary artery without angina pectoris: Secondary | ICD-10-CM

## 2020-11-28 NOTE — Patient Instructions
You have been scheduled for a treadmill stress test.  This testing requires a COVID screening within 48-72 hours prior to testing regardless of vaccination status.  We will provide you with a lab slip for COVID screening.  Please schedule your testing so that we will receive your results prior to your procedure.  Keep in mind, it can take 24-48 hours for some facilities to process COVID results.  We are happy to schedule you locally for COVID test if needed.  COVID testing can be done at [facility] by calling [phone]. We do not accept rapid test results, you must have a COVID PCR test completed at an accredited facility. If we do not receive the results prior to your scheduled appointment, we will have to reschedule your testing.

## 2020-11-28 NOTE — Progress Notes
Patient presents to office today after having an episode of hypertension and dizziness on Tuesday 11/26/20 where he had to take multiple nitros to improve BP. Patient states that he hasn't had another episode since then and his pressures have been better. He states last night that his BP was 140s systolic. Today in clinic his BP is 130/72 HR 84. Performed a device check for SDO to review. There was concern for limited battery life on device at last OV with SDO.    Will review device report and symptoms with SDO in clinic today.       SDO reviewed patient device report today in clinic. He recommends patient have another in office device check in [place]. Jomarie Longs in 1 month.

## 2020-12-09 ENCOUNTER — Ambulatory Visit: Admit: 2020-12-09 | Discharge: 2020-12-09 | Payer: MEDICARE

## 2020-12-09 ENCOUNTER — Encounter: Admit: 2020-12-09 | Discharge: 2020-12-09 | Payer: MEDICARE

## 2020-12-09 DIAGNOSIS — I2510 Atherosclerotic heart disease of native coronary artery without angina pectoris: Secondary | ICD-10-CM

## 2020-12-09 MED ORDER — RP DX TL-201 THALLOUS CHL MCI
.75 | Freq: Once | INTRAVENOUS | 0 refills | Status: CP
Start: 2020-12-09 — End: ?

## 2020-12-09 MED ORDER — RP DX TL-201 THALLOUS CHL MCI
3.5 | Freq: Once | INTRAVENOUS | 0 refills | Status: CP
Start: 2020-12-09 — End: ?

## 2020-12-12 ENCOUNTER — Encounter: Admit: 2020-12-12 | Discharge: 2020-12-12 | Payer: MEDICARE

## 2020-12-12 NOTE — Telephone Encounter
-----   Message from Vanice Sarah, MD sent at 12/12/2020 11:27 AM CST -----  [facility], can you please let [name] know that this is reassuring.  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  The area stented several months ago was a relatively large lateral area--this looks OK here.    Cc:  Dr. Wilford Grist

## 2020-12-12 NOTE — Telephone Encounter
Discussed stress test results with patient.     Patient reports for the past month he has been having some episodes of high blood pressure when he monitors morning and evening at home. He states he has had multiple readings 160's/90's. Patient denies chest pain or shortness of breath but does have some light headedness with it on occassion. Patient reports he has been taking nitro to lower his blood pressure.    Will route to Dr. Barry Dienes for his recommendations.

## 2020-12-26 ENCOUNTER — Ambulatory Visit: Admit: 2020-12-26 | Discharge: 2020-12-26 | Payer: MEDICARE

## 2020-12-26 ENCOUNTER — Encounter: Admit: 2020-12-26 | Discharge: 2020-12-26 | Payer: MEDICARE

## 2020-12-26 DIAGNOSIS — E785 Hyperlipidemia, unspecified: Secondary | ICD-10-CM

## 2020-12-26 DIAGNOSIS — I2510 Atherosclerotic heart disease of native coronary artery without angina pectoris: Secondary | ICD-10-CM

## 2020-12-26 DIAGNOSIS — I10 Essential (primary) hypertension: Secondary | ICD-10-CM

## 2020-12-26 DIAGNOSIS — I480 Paroxysmal atrial fibrillation: Secondary | ICD-10-CM

## 2021-02-20 ENCOUNTER — Encounter: Admit: 2021-02-20 | Discharge: 2021-02-20 | Payer: MEDICARE

## 2021-02-20 DIAGNOSIS — I10 Essential (primary) hypertension: Secondary | ICD-10-CM

## 2021-02-20 DIAGNOSIS — Z950 Presence of cardiac pacemaker: Secondary | ICD-10-CM

## 2021-02-20 DIAGNOSIS — I2510 Atherosclerotic heart disease of native coronary artery without angina pectoris: Secondary | ICD-10-CM

## 2021-02-20 DIAGNOSIS — E785 Hyperlipidemia, unspecified: Secondary | ICD-10-CM

## 2021-02-20 DIAGNOSIS — I4891 Unspecified atrial fibrillation: Secondary | ICD-10-CM

## 2021-02-20 NOTE — Progress Notes
Date of Service: 02/20/2021    Joshua Keller is a 80 y.o. male.       HPI     Joshua Keller was in the [facility] today for a working visit because his pacing generator has finally tripped the elective replacement indicator.    The device is still functioning normally and he is not have any particular cardiovascular symptoms.  He is scheduled to have some dental extractions on March 30 and we agreed that it would probably be best to get this done prior to the implantation of a new generator and, although unlikely, possible new leads.    He specifically denies any trouble with chest discomfort or breathlessness.  He has had no syncope or near syncope nor has he had any problems with TIA or stroke symptoms.         Vitals:    02/20/21 1159   BP: 126/78   BP Source: Arm, Left Upper   Patient Position: Sitting   Pulse: 79   SpO2: 98%   Weight: 88.8 kg (195 lb 12.8 oz)   Height: 170.2 cm (5' 7)   PainSc: Zero     Body mass index is 30.67 kg/m?[name]     Past Medical History  Patient Active Problem List    Diagnosis Date Noted   ? Screening for cardiovascular condition 05/27/2017     11/2016 - Abdominal Duplex:  1.8 cm mid-abdominal aorta.  No aneurysm.     ? Left-sided weakness 04/05/2014   ? S/P drug eluting coronary stent placement 01/03/2014   ? PUD (peptic ulcer disease) 01/01/2014   ? 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  Non-contrast CT-head in [facility] ED negative.  03/2014 - Episode of left leg weakness.  Hospitalized overnight on Neurology service at [facility].  EEG, CT-head negative.  Attributed to focal seizure.  No new meds.     ? 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  Moderate right external carotid stenosis.  Normal vertebral arteries.    11/2016 - Carotid Duplex:  50-79% (133) RICA, 0-49% (102) LICA.       ? CAD (coronary artery disease) 09/10/2009     3/94 - Inferior MI.        3/94 - CABG x 4, [facility], Dr. Lennart Pall: LIMA-LAD, SSVG-2 sites of OMB,                  SVG-RCA.        10/98 - EXEC: EF 55%, mild anteroseptal hypokinesis improves with stress, non-ischemic.        4/99 - EXEC: EF 55%, non-ischemic study.        3/01 - EXEC: EF 55-60%, non-ischemic study.        5/02 - Cardiac cath, [place]: LIMA-LAD patent, SVG-RCA patent, sequential graft-OM1 and                  OM2 patent, 50% plaque in graft to RCA (not obstructed), native RCA, LAD and LCX                   totally occluded, EF 50-55%        1/04 - Stress thallium (in-pt): EF 66%, low probability for exercise-induced ischemia.        1/06 - Cardiac cath: definite progression of CAD in SVG to RCA with focal 70-80%  stenosis in mid portion of graft. Native RCA totally occluded. SVG to two Cx                   marginal branches widely patent, as was LIMA to LAD. LVEF 55%.  PCI/stent                   to SVG to RCA (bare metal stent).   12/2013 -    DES to native, distal RCA beyond the insertion of vein graft [name]).  Other finding:  SVG patent to the OM 1 and 2 with a new eccentric 60% to 70%                       stenosis in the distal graft just before the 1st anastomosis. Our recommendation is to manage this conservatively.  07/13/20 Echo: Left Ventricle: EF 55%. The left ventricular size is normal. Concentric remodeling. There are segmental wall motion abnormalities, as coded in the diagram below (new compared to 03/30/2013 study). Abnormal septal motion consistent with post-operative state. Left ventricular diastolic dysfunction. Right Ventricle: The right ventricular size is normal. The right ventricular systolic function is mildly reduced. No hemodynamically significant valvular abnormalities.  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 80% to 90% stenosis in the mid saphenous vein graft in sequence to obtuse marginal 1 and obtuse marginal 2, which was thought to be the culprit lesion, with placement of a 4.0 x 28 mm Xience Skypoint stent that was post dilated with a 4.5 NC balloon. Patent LIMA to LAD graft, patent saphenous vein graft to right coronary artery graft with a patent stent in its midportion. Patent saphenous vein graft-obtuse marginal graft, status post percutaneous coronary intervention to the 80% to 90% stenosis in its mid segment. Low left ventricular end-diastolic pressure.       ? Hypertension 09/10/2009   ? Hyperlipidemia 09/10/2009     3/01 - Total 165, trig 139, HDL 39, LDL 98; Baycol 0.4mg .         1/02 - Total 228, trig 144, HDL 43, LDL 155; no meds.     ? 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     ? 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 Atrial fibrillation [facility]) 03/18/2007     12/03 [facility] with AF; Cardizem initiated with conversion to NSR.        1/04 - Transfer [place] d/t recurrence of AF and chest pressure. Cardizem d/c'd d/t                  bradycardia after conversion to NSR.         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 Toprol XL 100mg  initiated.     ? 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   HENT: Negative.    Eyes: Negative.    Cardiovascular: Negative.    Respiratory: Negative.    Endocrine: Negative.    Hematologic/Lymphatic: Negative.    Skin: Negative.    Musculoskeletal: Negative.    Gastrointestinal: Negative.    Genitourinary: Negative.    Neurological: Negative.    Psychiatric/Behavioral: Negative.    Allergic/Immunologic: Negative.        Physical Exam  Physical Exam   General Appearance: no distress   Skin: warm, no ulcers or xanthomas   Digits and Nails: no cyanosis or clubbing   Eyes: conjunctivae and lids normal, pupils are equal and round   Teeth/Gums/Palate: dentition unremarkable, no lesions   Lips & Oral Mucosa: no pallor or cyanosis   Neck Veins: normal JVP , neck veins are not distended   Thyroid: no nodules, masses, tenderness or enlargement   Chest Inspection: chest is normal in appearance   Respiratory Effort: breathing comfortably, no respiratory distress   Auscultation/Percussion: lungs clear to auscultation, no rales or rhonchi, no wheezing   PMI: PMI not enlarged or displaced   Cardiac Rhythm: regular rhythm and normal rate   Cardiac Auscultation: S1, S2 normal, no rub, no gallop   Murmurs: no murmur   Peripheral Circulation: normal peripheral circulation   Carotid Arteries: normal carotid upstroke bilaterally, no bruits   Radial Arteries: normal symmetric radial pulses   Abdominal Aorta: no abdominal aortic bruit   Pedal Pulses: normal symmetric pedal pulses   Lower Extremity Edema: no lower extremity edema   Abdominal Exam: soft, non-tender, no masses, bowel sounds normal   Liver & Spleen: no organomegaly   Gait & Station: walks without assistance   Muscle Strength: normal muscle tone   Orientation: oriented to time, place and person   Affect & Mood: appropriate and sustained affect   Language and Memory: patient responsive and seems to comprehend information   Neurologic Exam: neurological assessment grossly intact   Other: moves all extremities      Cardiovascular Health Factors  Vitals BP Readings from Last 3 Encounters:   02/20/21 126/78   11/28/20 130/72   11/26/20 (!) 150/80     Wt Readings from Last 3 Encounters:   02/20/21 88.8 kg (195 lb 12.8 oz)   11/26/20 88.5 kg (195 lb)   10/25/20 88.5 kg (195 lb)     BMI Readings from Last 3 Encounters:   02/20/21 30.67 kg/m?   11/26/20 30.54 kg/m?   10/25/20 30.54 kg/m?      Smoking Social History     Tobacco Use   Smoking Status Never Smoker   Smokeless Tobacco Never Used      Lipid Profile Cholesterol   Date Value Ref Range Status   07/12/2020 139 <200 MG/DL Final     HDL   Date Value Ref Range Status   07/12/2020 33 (L) >40 MG/DL Final     LDL   Date Value Ref Range Status   07/12/2020 76 <100 mg/dL Final     Triglycerides   Date Value Ref Range Status   07/12/2020 208 (H) <150 MG/DL Final      Blood Sugar Hemoglobin A1C   Date Value Ref Range Status   07/13/2020 5.8 4.0 - 6.0 % Final     Comment:     The ADA recommends that most patients with type 1 and type 2 diabetes maintain   an A1c level <7%.       Glucose   Date Value Ref Range Status   07/15/2020 101 (H) 70 - 100 MG/DL Final   16/09/9603 540 (H) 70 - 100 MG/DL Final   98/10/9146 829 (H) 70 - 100 MG/DL Final   56/21/3086 93 70 - 110 MG/DL Final   57/84/6962 952 70 - 110 MG/DL Final   84/13/2440 97 70 - 110 MG/DL Final     Glucose, POC   Date Value Ref Range Status  04/05/2014 101 (H) 70 - 100 MG/DL Final          Problems Addressed Today  Encounter Diagnoses   Name Primary?   ? Pacemaker        Assessment and Plan       Pacemaker  We will schedule replacement of his pacemaker generator.  He is scheduled for dental extractions on March 30 and I think it would be best for the dental extractions to be done prior to the pacemaker procedure.      Current Medications (including today's revisions)  ? acetaminophen/diphenhydramine (TYLENOL PM PO) Take 325 mg by mouth at bedtime daily.   ? aspirin EC 81 mg tablet Take 1 Tab by mouth daily.   ? atorvastatin (LIPITOR) 80 mg tablet Take one tablet by mouth daily.   ? clopiDOGrel (PLAVIX) 75 mg tablet Take one tablet by mouth daily.   ? hydrOXYzine HCL (ATARAX) 25 mg tablet Take one tablet by mouth three times daily as needed. Indications: anxious   ? lisinopril (PRINIVIL) 10 mg tablet Take one tablet by mouth daily.   ? loratadine (CLARITIN) 10 mg PO tablet Take 10 mg by mouth Daily.   ? midodrine (PROAMATINE) 2.5 mg tablet Take one tablet by mouth every 4 hours while awake. Indications: a feeling of dizziness upon standing due to a drop in blood pressure   ? nitroglycerin (NITROSTAT) 0.4 mg tablet Place one tablet under tongue every 5 minutes as needed for Chest Pain.   ? pantoprazole DR (PROTONIX) 20 mg tablet Take one tablet by mouth daily.   ? prednisone (DELTASONE) 1 mg tablet Take 3-4 mg by mouth daily with breakfast.   ? sotaloL (BETAPACE) 120 mg tab TAKE 1 TABLET BY MOUTH TWICE A DAY   ? tamsulosin (FLOMAX) 0.4 mg capsule Take 0.4 mg by mouth daily. Do not crush, chew or open capsules. Take 30 minutes following the same meal each day.     Total time spent on today's office visit was 25 minutes.  This includes face-to-face in person visit with patient as well as nonface-to-face time including review of the EMR, outside records, labs, radiologic studies, echocardiogram & other cardiovascular studies, formation of treatment plan, after visit summary, future disposition, and lastly on documentation.

## 2021-02-20 NOTE — Assessment & Plan Note
We will schedule replacement of his pacemaker generator.  He is scheduled for dental extractions on March 30 and I think it would be best for the dental extractions to be done prior to the pacemaker procedure.

## 2021-02-21 ENCOUNTER — Encounter: Admit: 2021-02-21 | Discharge: 2021-02-21 | Payer: MEDICARE

## 2021-02-21 DIAGNOSIS — Z950 Presence of cardiac pacemaker: Secondary | ICD-10-CM

## 2021-02-21 DIAGNOSIS — Z45010 Encounter for checking and testing of cardiac pacemaker pulse generator [battery]: Secondary | ICD-10-CM

## 2021-02-21 DIAGNOSIS — R001 Bradycardia, unspecified: Secondary | ICD-10-CM

## 2021-02-21 DIAGNOSIS — I480 Paroxysmal atrial fibrillation: Secondary | ICD-10-CM

## 2021-02-21 MED ORDER — SODIUM CHLORIDE 0.9 % IV SOLP
INTRAVENOUS | 0 refills
Start: 2021-02-21 — End: ?

## 2021-02-21 MED ORDER — LIDOCAINE (PF) 10 MG/ML (1 %) IJ SOLN
.2 mL | INTRAMUSCULAR | 0 refills | PRN
Start: 2021-02-21 — End: ?

## 2021-02-21 MED ORDER — CEFAZOLIN INJ 1GM IVP
2 g | Freq: Once | INTRAVENOUS | 0 refills
Start: 2021-02-21 — End: ?

## 2021-02-21 NOTE — Patient Instructions
ELECTROPHYSIOLOGY PRE-ADMISSION INSTRUCTIONS    Patient Name: Joshua Keller  MRN#: [id number]  Date of Birth: November 19, 1941 (80 y.o.)  Today's Date: 02/21/2021    PROCEDURE:  You are scheduled for a Generator Change with Dr. Christain [place].Pimentel.      ARRIVAL TIME:  [facility] to the [facility] admitting office on the ground floor of the [facility] on: 03/10/21  The EP Lab will call to notify you of your arrival time.  They will call on the business day prior to your procedure.  (If you have any questions regarding your arrival time for the Electrophysiology Lab, please call the EP Lab at [phone].)    PRE-PROCEDURE APPOINTMENTS:  Completed  Office visit to update history and physical (requirement within 30 days of procedure)  with Dr. Barry Dienes .   Complete between 02/25/21 and 03/06/21 Pre-Admission lab work: BMP, CBC and CMP at the lab of your choice. Orders faxed to [facility] in [place] and also included in your instructions sent in the mail. Please complete these labs sometime between 02/25/21 and 03/06/21.         SPECIAL MEDICATION INSTRUCTIONS  Nothing to eat or drink after midnight before your procedure.  Take your prescription medications with a sip of water as instructed.     Any new prescriptions will be sent to your pharmacy listed on file with [place].   HOLD ALL over the counter vitamins or supplements on the morning of your procedure.  ACE/ARBs: Prinivil (lisinopril) -- hold the morning of your procedure.        Additional Instructions  If you wear CPAP, please bring your mask and machine with you to the hospital.    Take a bath or shower with anti-bacterial soap the evening before, or the morning of the procedure. We will give this to you at your office visit.     Bring photo ID and your health insurance card(s).    Arrange for a driver to take you home from the hospital.    Bring an accurate list of your current medications with you to the hospital (all meds and supplements taken daily).    Wear comfortable clothes and don't bring valuables, other than photo identification card, with you to the hospital.    Please pack a bag for an overnight stay.     Please review your pre-procedure instructions and call the office at [phone] with any questions. You may ask to speak with any of Dr. Prudencio Burly C.Pimentel's nurses. There are several of [place] in the office that can assist you. For questions regarding post procedure care or restrictions please refer to the Your Care Instructions included in the  a Generator Change Packet.     ALLERGIES  Allergies   Allergen Reactions   ? Sulfamethoxazole SEE COMMENTS     Elevated pancytopenia   ? Sulfate Salt ANAPHYLAXIS   ? Trimethoprim SEE COMMENTS     Elevated pancytopenia   ? Chlorpromazine UNKNOWN       CURRENT MEDICATIONS  Outpatient Encounter Medications as of 02/21/2021   Medication Sig Dispense Refill   ? acetaminophen/diphenhydramine (TYLENOL PM PO) Take 325 mg by mouth at bedtime daily.     ? aspirin EC 81 mg tablet Take 1 Tab by mouth daily. 90 Tab    ? atorvastatin (LIPITOR) 80 mg tablet Take one tablet by mouth daily. 90 tablet 3   ? clopiDOGrel (PLAVIX) 75 mg tablet Take one tablet by mouth daily. 90 tablet  3   ? hydrOXYzine HCL (ATARAX) 25 mg tablet Take one tablet by mouth three times daily as needed. Indications: anxious 30 tablet 0   ? lisinopril (PRINIVIL) 10 mg tablet Take one tablet by mouth daily. 90 tablet 1   ? loratadine (CLARITIN) 10 mg PO tablet Take 10 mg by mouth Daily.     ? midodrine (PROAMATINE) 2.5 mg tablet Take one tablet by mouth every 4 hours while awake. Indications: a feeling of dizziness upon standing due to a drop in blood pressure 50 tablet 3   ? nitroglycerin (NITROSTAT) 0.4 mg tablet Place one tablet under tongue every 5 minutes as needed for Chest Pain. 25 tablet 3   ? pantoprazole DR (PROTONIX) 20 mg tablet Take one tablet by mouth daily. 60 tablet 0   ? prednisone (DELTASONE) 1 mg tablet Take 3-4 mg by mouth daily with breakfast.     ? sotaloL (BETAPACE) 120 mg tab TAKE 1 TABLET BY MOUTH TWICE A DAY 180 tablet 3   ? tamsulosin (FLOMAX) 0.4 mg capsule Take 0.4 mg by mouth daily. Do not crush, chew or open capsules. Take 30 minutes following the same meal each day.       No facility-administered encounter medications on file as of 02/21/2021.     _________________________________________  Form completed by: Irving Burton, RN  Date completed: 02/21/21  Method: Via telephone and mailed to the patient. and sent via My Chart.

## 2021-02-21 NOTE — Telephone Encounter
-----   Message from Irving Burton, RN sent at 02/20/2021  5:24 PM CDT -----  Regarding: FW: gen change    ----- Message -----  From: Lauralee Evener, RN  Sent: 02/20/2021  12:32 PM CDT  To: [facility] Nurse Ep Team A  Subject: gen change                                       Hello,    You may have already received a flag with the device report on this patient. He had an in office device check today in the [facility] and found to be at [facility]. He will need scheduled for a gen change. Dr. Barry Dienes happened to be in the [facility] and went ahead and saw him for an updated H&P. The patient is requesting to have this done after 03/05/21 as he is having some dental work done that day. Please let me know if you need anything from me.   Thank you,  Shawna Orleans, RN

## 2021-02-21 NOTE — Telephone Encounter
Called patient to discuss need for generator change with RCP. Will plan to schedule procedure 03/10/21. Patient has already completed H&P (Dr. Barry Dienes 02/20/21). Patient will plan to complete pre-procedure lab work at [facility] in [place], [place]. Orders faxed to [phone].    Reviewed plan with the patient. Patient verbalized understanding and does not have any further questions or concerns. No further education requested from patient. Patient has our contact information for future needs.

## 2021-02-28 ENCOUNTER — Encounter: Admit: 2021-02-28 | Discharge: 2021-02-28 | Payer: MEDICARE

## 2021-02-28 DIAGNOSIS — Z950 Presence of cardiac pacemaker: Secondary | ICD-10-CM

## 2021-02-28 DIAGNOSIS — I480 Paroxysmal atrial fibrillation: Secondary | ICD-10-CM

## 2021-02-28 LAB — BASIC METABOLIC PANEL
Lab: 1.4 — ABNORMAL HIGH (ref 0.72–1.25)
Lab: 102
Lab: 111 — ABNORMAL HIGH (ref 98–107)
Lab: 139
Lab: 21 — ABNORMAL LOW (ref 23–31)
Lab: 24
Lab: 4.7
Lab: 50 — ABNORMAL LOW (ref >60)
Lab: 8.9

## 2021-02-28 LAB — MAGNESIUM: Lab: 1.9 K/UL (ref 150–400)

## 2021-02-28 LAB — CBC: Lab: 6.2 FL (ref 7–11)

## 2021-03-10 ENCOUNTER — Encounter: Admit: 2021-03-10 | Discharge: 2021-03-10 | Payer: MEDICARE

## 2021-03-10 DIAGNOSIS — E785 Hyperlipidemia, unspecified: Secondary | ICD-10-CM

## 2021-03-10 DIAGNOSIS — I10 Essential (primary) hypertension: Secondary | ICD-10-CM

## 2021-03-10 DIAGNOSIS — I4891 Unspecified atrial fibrillation: Secondary | ICD-10-CM

## 2021-03-10 DIAGNOSIS — I2510 Atherosclerotic heart disease of native coronary artery without angina pectoris: Secondary | ICD-10-CM

## 2021-03-10 MED ADMIN — MIDAZOLAM 1 MG/ML IJ SOLN [10607]: .5 mg | INTRAVENOUS | @ 13:00:00 | Stop: 2021-03-10 | NDC 00409230516

## 2021-03-10 MED ADMIN — MIDAZOLAM 1 MG/ML IJ SOLN [10607]: 1 mg | INTRAVENOUS | @ 13:00:00 | Stop: 2021-03-10 | NDC 00409230516

## 2021-03-10 MED ADMIN — FENTANYL CITRATE (PF) 50 MCG/ML IJ SOLN [3037]: 50 ug | INTRAVENOUS | @ 13:00:00 | Stop: 2021-03-10 | NDC 00409909425

## 2021-03-10 MED ADMIN — BUPIVACAINE (PF) 0.25 % (2.5 MG/ML) IJ SOLN [87866]: 10 mL | @ 13:00:00 | Stop: 2021-03-10 | NDC 00409155918

## 2021-03-10 MED ADMIN — SODIUM CHLORIDE 0.9 % IV SOLP [27838]: 1000.000 mL | INTRAVENOUS | @ 12:00:00 | Stop: 2021-03-10 | NDC 00338004904

## 2021-03-10 MED ADMIN — LIDOCAINE (PF) 10 MG/ML (1 %) IJ SOLN [95838]: 10 mL | @ 13:00:00 | Stop: 2021-03-10 | NDC 55150016205

## 2021-03-10 MED ADMIN — SODIUM CHLORIDE 0.9 % IV SOLP [27838]: 500 mL | @ 13:00:00 | Stop: 2021-03-10 | NDC 00338004904

## 2021-03-10 MED ADMIN — CEFAZOLIN INJ 1GM IVP [210319]: 2 g | INTRAVENOUS | @ 13:00:00 | Stop: 2021-03-10 | NDC 00409080501

## 2021-03-10 MED ADMIN — CEFAZOLIN 1 GRAM IJ SOLR [1445]: 500 mL | @ 13:00:00 | Stop: 2021-03-10 | NDC 00409080501

## 2021-03-12 ENCOUNTER — Encounter: Admit: 2021-03-12 | Discharge: 2021-03-12 | Payer: MEDICARE

## 2021-03-18 ENCOUNTER — Encounter: Admit: 2021-03-18 | Discharge: 2021-03-18 | Payer: MEDICARE

## 2021-03-18 NOTE — Progress Notes
Left  prepectoral incision is clean, dry, well approximated, and healing without evidence of drainage or discharge. Dermabond dry and intact. Pt reports no adverse symptoms. Incision care, including signs and symptoms of infection, reviewed(as below). Pt verbalized understanding and will remain in phone contact.    You may shower however, avoid direct contact with the incision (allow the water to hit the back of your shoulder rather than directly on the incision).    Do not submerge incision in tub, pool, hot tub, or lake for 4 weeks.    Unless your incision is bleeding or draining, keep it open to air.    Avoid applying deodorants, powders, creams, lotions, etc. to your incision for 4 weeks.    Usually there are no stitches to be removed.    Your incision should gradually look better each day. Please notify our office immediately if you notice any of the following:   -an increase in swelling or redness   -any drainage   -increasing pain at the incision site  -fever over 100 degrees

## 2021-04-16 ENCOUNTER — Encounter: Admit: 2021-04-16 | Discharge: 2021-04-16 | Payer: MEDICARE

## 2021-04-17 ENCOUNTER — Encounter: Admit: 2021-04-17 | Discharge: 2021-04-17 | Payer: MEDICARE

## 2021-04-17 DIAGNOSIS — I480 Paroxysmal atrial fibrillation: Secondary | ICD-10-CM

## 2021-04-17 DIAGNOSIS — E785 Hyperlipidemia, unspecified: Secondary | ICD-10-CM

## 2021-04-17 DIAGNOSIS — I10 Essential (primary) hypertension: Secondary | ICD-10-CM

## 2021-04-17 DIAGNOSIS — Z950 Presence of cardiac pacemaker: Secondary | ICD-10-CM

## 2021-04-17 DIAGNOSIS — I2510 Atherosclerotic heart disease of native coronary artery without angina pectoris: Secondary | ICD-10-CM

## 2021-04-17 DIAGNOSIS — I4891 Unspecified atrial fibrillation: Secondary | ICD-10-CM

## 2021-04-17 NOTE — Assessment & Plan Note
No problems related to his recent generator replacement.  The incision appears to be healing normally.

## 2021-04-17 NOTE — Progress Notes
Date of Service: 04/17/2021    Joshua Keller is a 80 y.o. male.       HPI     Joshua Keller was in the [place] clinic today about a month after he underwent pacemaker generator replacement.  He did not have any trouble with the procedure and the incision looks fine.    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  One of their grandsons is getting married in [place] on Saturday so there will be a big gathering of family for that event.    He is not having any trouble with lightheadedness or palpitations.  He has had no problems with breathlessness or chest discomfort.       Vitals:    04/17/21 1051 04/17/21 1103   BP: 106/56 104/64   BP Source: Arm, Left Upper Arm, Right Upper   Pulse: 80    SpO2: 98%    O2 Device: None (Room air)    PainSc: Zero    Weight: 85.2 kg (187 lb 12.8 oz)    Height: 170.2 cm (5' 7)      Body mass index is 29.41 kg/m?[name]     Past Medical History  Patient Active Problem List    Diagnosis Date Noted   ? Screening for cardiovascular condition 05/27/2017     11/2016 - Abdominal Duplex:  1.8 cm mid-abdominal aorta.  No aneurysm.     ? Left-sided weakness 04/05/2014   ? S/P drug eluting coronary stent placement 01/03/2014   ? PUD (peptic ulcer disease) 01/01/2014   ? 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  Non-contrast CT-head in [facility] ED negative.  03/2014 - Episode of left leg weakness.  Hospitalized overnight on Neurology service at [facility].  EEG, CT-head negative.  Attributed to focal seizure.  No new meds.     ? 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  Moderate right external carotid stenosis.  Normal vertebral arteries.    11/2016 - Carotid Duplex:  50-79% (133) RICA, 0-49% (102) LICA.       ? CAD (coronary artery disease) 09/10/2009     3/94 - Inferior MI.        3/94 - CABG x 4, [facility], Dr. Lennart Pall: LIMA-LAD, SSVG-2 sites of OMB,                  SVG-RCA.        10/98 - EXEC: EF 55%, mild anteroseptal hypokinesis improves with stress, non-ischemic.        4/99 - EXEC: EF 55%, non-ischemic study.        3/01 - EXEC: EF 55-60%, non-ischemic study.        5/02 - Cardiac cath, [facility]: LIMA-LAD patent, SVG-RCA patent, sequential graft-OM1 and                  OM2 patent, 50% plaque in graft to RCA (not obstructed), native RCA, LAD and LCX                   totally occluded, EF 50-55%        1/04 - Stress thallium (in-pt): EF 66%, low probability for exercise-induced ischemia.        1/06 - Cardiac cath: definite progression of CAD in SVG to RCA with focal 70-80%  stenosis in mid portion of graft. Native RCA totally occluded. SVG to two Cx                   marginal branches widely patent, as was LIMA to LAD. LVEF 55%.  PCI/stent                   to SVG to RCA (bare metal stent).   12/2013 -    DES to native, distal RCA beyond the insertion of vein graft Joshua Keller).  Other finding:  SVG patent to the OM 1 and 2 with a new eccentric 60% to 70%                       stenosis in the distal graft just before the 1st anastomosis. Our recommendation is to manage this conservatively.  07/13/20 Echo: Left Ventricle: EF 55%. The left ventricular size is normal. Concentric remodeling. There are segmental wall motion abnormalities, as coded in the diagram below (new compared to 03/30/2013 study). Abnormal septal motion consistent with post-operative state. Left ventricular diastolic dysfunction. Right Ventricle: The right ventricular size is normal. The right ventricular systolic function is mildly reduced. No hemodynamically significant valvular abnormalities.  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 80% to 90% stenosis in the mid saphenous vein graft in sequence to obtuse marginal 1 and obtuse marginal 2, which was thought to be the culprit lesion, with placement of a 4.0 x 28 mm Xience Skypoint stent that was post dilated with a 4.5 NC balloon. Patent LIMA to LAD graft, patent saphenous vein graft to right coronary artery graft with a patent stent in its midportion. Patent saphenous vein graft-obtuse marginal graft, status post percutaneous coronary intervention to the 80% to 90% stenosis in its mid segment. Low left ventricular end-diastolic pressure.       ? Hypertension 09/10/2009   ? Hyperlipidemia 09/10/2009     3/01 - Total 165, trig 139, HDL 39, LDL 98; Baycol 0.4mg .         1/02 - Total 228, trig 144, HDL 43, LDL 155; no meds.     ? 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     ? 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 Atrial fibrillation [facility]) 03/18/2007     12/03 [facility] with AF; Cardizem initiated with conversion to NSR.        1/04 - Transfer [place] d/t recurrence of AF and chest pressure. Cardizem d/c'd d/t                  bradycardia after conversion to NSR.         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 Toprol XL 100mg  initiated.     ? 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   HENT: Positive for tinnitus.    Eyes: Negative.    Cardiovascular: Negative.    Respiratory: Negative.    Endocrine: Negative.    Hematologic/Lymphatic: Negative.    Skin: Negative.    Musculoskeletal: Negative.    Gastrointestinal: Negative.    Genitourinary: Negative.    Neurological: Positive for dizziness, light-headedness, numbness and paresthesias.   Psychiatric/Behavioral: Negative.    Allergic/Immunologic: Negative.  Physical Exam    Physical Exam   General Appearance: no distress   Skin: warm, no ulcers or xanthomas, R subclavian fossa incision well-healed   Digits and Nails: no cyanosis or clubbing   Eyes: conjunctivae and lids normal, pupils are equal and round Teeth/Gums/Palate: dentition unremarkable, no lesions   Lips & Oral Mucosa: no pallor or cyanosis   Neck Veins: normal JVP , neck veins are not distended   Thyroid: no nodules, masses, tenderness or enlargement   Chest Inspection: chest is normal in appearance   Respiratory Effort: breathing comfortably, no respiratory distress   Auscultation/Percussion: lungs clear to auscultation, no rales or rhonchi, no wheezing   PMI: PMI not enlarged or displaced   Cardiac Rhythm: regular rhythm and normal rate   Cardiac Auscultation: S1, S2 normal, no rub, no gallop   Murmurs: no murmur   Peripheral Circulation: normal peripheral circulation   Carotid Arteries: normal carotid upstroke bilaterally, no bruits   Radial Arteries: normal symmetric radial pulses   Abdominal Aorta: no abdominal aortic bruit   Pedal Pulses: normal symmetric pedal pulses   Lower Extremity Edema: no lower extremity edema   Abdominal Exam: soft, non-tender, no masses, bowel sounds normal   Liver & Spleen: no organomegaly   Gait & Station: walks without assistance   Muscle Strength: normal muscle tone   Orientation: oriented to time, place and person   Affect & Mood: appropriate and sustained affect   Language and Memory: patient responsive and seems to comprehend information   Neurologic Exam: neurological assessment grossly intact   Other: moves all extremities      Cardiovascular Studies    EKG:  Atrial-paced rhythm.  RBBB.  Old inferior MI.    Cardiovascular Health Factors  Vitals BP Readings from Last 3 Encounters:   04/17/21 104/64   03/10/21 124/80   02/20/21 126/78     Wt Readings from Last 3 Encounters:   04/17/21 85.2 kg (187 lb 12.8 oz)   03/10/21 86.5 kg (190 lb 11.2 oz)   02/20/21 88.8 kg (195 lb 12.8 oz)     BMI Readings from Last 3 Encounters:   04/17/21 29.41 kg/m?   03/10/21 29.87 kg/m?   02/20/21 30.67 kg/m?      Smoking Social History     Tobacco Use   Smoking Status Never Smoker   Smokeless Tobacco Never Used      Lipid Profile Cholesterol   Date Value Ref Range Status   01/10/2021 151  Final     HDL   Date Value Ref Range Status   01/10/2021 33 (L) >=40 Final     LDL   Date Value Ref Range Status   01/10/2021 75  Final     Triglycerides   Date Value Ref Range Status   01/10/2021 215 (H) <150 Final      Blood Sugar Hemoglobin A1C   Date Value Ref Range Status   07/13/2020 5.8 4.0 - 6.0 % Final     Comment:     The ADA recommends that most patients with type 1 and type 2 diabetes maintain   an A1c level <7%.       Glucose   Date Value Ref Range Status   02/28/2021 102  Final   01/10/2021 98  Final   07/15/2020 101 (H) 70 - 100 MG/DL Final   13/07/6577 93 70 - 110 MG/DL Final   46/96/2952 841 70 - 110 MG/DL Final   32/44/0102 97 70 - 110  MG/DL Final     Glucose, POC   Date Value Ref Range Status   04/05/2014 101 (H) 70 - 100 MG/DL Final          Problems Addressed Today  Encounter Diagnoses   Name Primary?   ? Paroxysmal atrial fibrillation (HCC) Yes   ? Coronary artery disease involving native coronary artery of native heart without angina pectoris    ? Pacemaker        Assessment and Plan       Atrial fibrillation [facility])  He has not had recurrent atrial fibrillation according to recent device checks.    CAD (coronary artery disease)  No chest discomfort or other symptoms to suggest recurrent obstructive coronary disease.    Pacemaker  No problems related to his recent generator replacement.  The incision appears to be healing normally.      Current Medications (including today's revisions)  ? acetaminophen/diphenhydramine (TYLENOL PM PO) Take 325 mg by mouth at bedtime daily.   ? aspirin EC 81 mg tablet Take 1 Tab by mouth daily.   ? atorvastatin (LIPITOR) 80 mg tablet Take one tablet by mouth daily.   ? clopiDOGrel (PLAVIX) 75 mg tablet Take one tablet by mouth daily.   ? hydrOXYzine HCL (ATARAX) 25 mg tablet Take one tablet by mouth three times daily as needed. Indications: anxious   ? lisinopril (PRINIVIL) 10 mg tablet Take one tablet by mouth daily.   ? loratadine (CLARITIN) 10 mg PO tablet Take 10 mg by mouth Daily.   ? midodrine (PROAMATINE) 2.5 mg tablet Take one tablet by mouth every 4 hours while awake. Indications: a feeling of dizziness upon standing due to a drop in blood pressure (Patient taking differently: Take 2.5 mg by mouth as Needed. Indications: a feeling of dizziness upon standing due to a drop in blood pressure)   ? nitroglycerin (NITROSTAT) 0.4 mg tablet Place one tablet under tongue every 5 minutes as needed for Chest Pain.   ? pantoprazole DR (PROTONIX) 20 mg tablet Take one tablet by mouth daily. (Patient taking differently: Take 20 mg by mouth as Needed.)   ? prednisone (DELTASONE) 1 mg tablet Take 3-4 mg by mouth daily with breakfast.   ? sotaloL (BETAPACE) 120 mg tab TAKE 1 TABLET BY MOUTH TWICE A DAY   ? tamsulosin (FLOMAX) 0.4 mg capsule Take 0.4 mg by mouth daily. Do not crush, chew or open capsules. Take 30 minutes following the same meal each day.     Total time spent on today's office visit was 20 minutes.  This includes face-to-face in person visit with patient as well as nonface-to-face time including review of the EMR, outside records, labs, radiologic studies, echocardiogram & other cardiovascular studies, formation of treatment plan, after visit summary, future disposition, and lastly on documentation.

## 2021-04-17 NOTE — Assessment & Plan Note
He has not had recurrent atrial fibrillation according to recent device checks.

## 2021-04-17 NOTE — Assessment & Plan Note
No chest discomfort or other symptoms to suggest recurrent obstructive coronary disease.

## 2021-05-08 ENCOUNTER — Encounter: Admit: 2021-05-08 | Discharge: 2021-05-08 | Payer: MEDICARE

## 2021-05-08 MED ORDER — AMOXICILLIN 500 MG PO CAP
500 mg | ORAL_CAPSULE | ORAL | 0 refills | 7.00000 days | Status: DC
Start: 2021-05-08 — End: 2021-05-08

## 2021-05-08 MED ORDER — CEPHALEXIN 500 MG PO CAP
500 mg | ORAL_CAPSULE | Freq: Four times a day (QID) | ORAL | 0 refills | Status: AC
Start: 2021-05-08 — End: ?

## 2021-05-08 NOTE — Telephone Encounter
Received a call from patient that he is concerned that he is getting an infection at his incision site.  Pt had a pacemaker placed on 4/4 by Dr. Ladean Raya.  Pt states that his incision is red and sore and he thinks there is a little pimple starting on the incision.  He denies any fever or drainage.      Scheduled pt for nurse visit in the [place] office today.

## 2021-05-08 NOTE — Telephone Encounter
-----   Message from Kathreen Cornfield, MD sent at 05/08/2021  3:43 PM CDT -----  I think he needs some antibiotics.  Give him keflex 500 qid and set him up to be seen next week again  ----- Message -----  From: Florene Route, RN  Sent: 05/08/2021   3:06 PM CDT  To: Kathreen Cornfield, MD    Please let me know if you have any further recommendations and I will follow up with the patient. Thank you!

## 2021-05-08 NOTE — Progress Notes
Patient reports to [facility] clinic today with reports of increasing tenderness and a "pimple" on his generator change incision. He states that these symptoms just start a couple of days ago. Patient denies any swelling, drainage, or fevers. Patient states that he also has been treated for pneumonia over the last week and has been on a one week course of Levaquin antibiotics. He is completely asymptomatic in clinic today.     Patient had generator change with Dr. Bradly Bienenstock on 03/10/21. Patient had 1 week incision check and follow up with Dr. Barry Dienes on 04/17/21. Both appointments were unremarkable in regards to his incision.       Upon examination the incision does have a raised erythematic spot at the end of his incision. Upon palpation the rest of the incision is not raised or discolored. No evidence of drainage. Patient does state that incision is "sore to touch".       Dr. Avie Arenas also examined this patient's incision. She recommended that patient should cover the incision with some gauze padding and use antibiotic ointment on incision until healed. [facility] also recommended a 5 day course of Amoxicillin 500mg  orally TID.       Will route to Dr. for review and any further recommendations for patient.

## 2021-05-15 ENCOUNTER — Encounter: Admit: 2021-05-15 | Discharge: 2021-05-15 | Payer: MEDICARE

## 2021-05-15 DIAGNOSIS — Z950 Presence of cardiac pacemaker: Secondary | ICD-10-CM

## 2021-05-15 MED ORDER — CEPHALEXIN 500 MG PO CAP
500 mg | ORAL_CAPSULE | Freq: Four times a day (QID) | ORAL | 0 refills | Status: AC
Start: 2021-05-15 — End: ?

## 2021-05-15 NOTE — Progress Notes
Date of Service: 05/15/2021    Joshua Keller is a 80 y.o. male.       HPI     Joshua [place] was in the [place] [facility] today for a pacemaker incision check.  He had a generator replacement a few weeks ago and presented last week with a pustule at the medial end of the incision.  He was started on Keflex and has completed about a 1 week course with definite improvement in the appearance of the incision.  The pustule drained a few days ago and since then things seem to have settled down.  Joshua Keller's had no systemic symptoms such as fever or chills and again the pocket itself looks fine.         Vitals:    05/15/21 0826   BP: 118/60   BP Source: Arm, Left Upper   Pulse: 80   SpO2: 100%   O2 Device: None (Room air)   PainSc: Zero   Weight: 83.7 kg (184 lb 9.6 oz)   Height: 170.2 cm (5' 7)     Body mass index is 28.91 kg/m?[name]     Past Medical History  Patient Active Problem List    Diagnosis Date Noted   ? Screening for cardiovascular condition 05/27/2017     11/2016 - Abdominal Duplex:  1.8 cm mid-abdominal aorta.  No aneurysm.     ? Left-sided weakness 04/05/2014   ? S/P drug eluting coronary stent placement 01/03/2014   ? PUD (peptic ulcer disease) 01/01/2014   ? 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  Non-contrast CT-head in [facility] ED negative.  03/2014 - Episode of left leg weakness.  Hospitalized overnight on Neurology service at [facility].  EEG, CT-head negative.  Attributed to focal seizure.  No new meds.     ? 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  Moderate right external carotid stenosis.  Normal vertebral arteries.    11/2016 - Carotid Duplex:  50-79% (133) RICA, 0-49% (102) LICA.       ? CAD (coronary artery disease) 09/10/2009     3/94 - Inferior MI.        3/94 - CABG x 4, [facility], Dr. Lennart Pall: LIMA-LAD, SSVG-2 sites of OMB,                  SVG-RCA.        10/98 - EXEC: EF 55%, mild anteroseptal hypokinesis improves with stress, non-ischemic.        4/99 - EXEC: EF 55%, non-ischemic study.        3/01 - EXEC: EF 55-60%, non-ischemic study.        5/02 - Cardiac cath, [place]: LIMA-LAD patent, SVG-RCA patent, sequential graft-OM1 and                  OM2 patent, 50% plaque in graft to RCA (not obstructed), native RCA, LAD and LCX                   totally occluded, EF 50-55%        1/04 - Stress thallium (in-pt): EF 66%, low probability for exercise-induced ischemia.        1/06 - Cardiac cath: definite progression of CAD in SVG to RCA with focal 70-80%  stenosis in mid portion of graft. Native RCA totally occluded. SVG to two Cx                   marginal branches widely patent, as was LIMA to LAD. LVEF 55%.  PCI/stent                   to SVG to RCA (bare metal stent).   12/2013 -    DES to native, distal RCA beyond the insertion of vein graft [name]).  Other finding:  SVG patent to the OM 1 and 2 with a new eccentric 60% to 70%                       stenosis in the distal graft just before the 1st anastomosis. Our recommendation is to manage this conservatively.  07/13/20 Echo: Left Ventricle: EF 55%. The left ventricular size is normal. Concentric remodeling. There are segmental wall motion abnormalities, as coded in the diagram below (new compared to 03/30/2013 study). Abnormal septal motion consistent with post-operative state. Left ventricular diastolic dysfunction. Right Ventricle: The right ventricular size is normal. The right ventricular systolic function is mildly reduced. No hemodynamically significant valvular abnormalities.  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 80% to 90% stenosis in the mid saphenous vein graft in sequence to obtuse marginal 1 and obtuse marginal 2, which was thought to be the culprit lesion, with placement of a 4.0 x 28 mm Xience Skypoint stent that was post dilated with a 4.5 NC balloon. Patent LIMA to LAD graft, patent saphenous vein graft to right coronary artery graft with a patent stent in its midportion. Patent saphenous vein graft-obtuse marginal graft, status post percutaneous coronary intervention to the 80% to 90% stenosis in its mid segment. Low left ventricular end-diastolic pressure.       ? Hypertension 09/10/2009   ? Hyperlipidemia 09/10/2009     3/01 - Total 165, trig 139, HDL 39, LDL 98; Baycol 0.4mg .         1/02 - Total 228, trig 144, HDL 43, LDL 155; no meds.     ? 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     ? 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 Atrial fibrillation [facility]) 03/18/2007     12/03 [facility] with AF; Cardizem initiated with conversion to NSR.        1/04 - Transfer [facility] d/t recurrence of AF and chest pressure. Cardizem d/c'd d/t                  bradycardia after conversion to NSR.         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 Toprol XL 100mg  initiated.     ? 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   HENT: Negative.    Eyes: Negative.    Cardiovascular: Negative.    Respiratory: Negative.    Endocrine: Negative.    Hematologic/Lymphatic: Negative.    Skin: Negative.    Musculoskeletal: Negative.    Gastrointestinal: Negative.    Genitourinary: Negative.    Neurological: Negative.    Psychiatric/Behavioral: Negative.    Allergic/Immunologic: Negative.        Physical Exam  Physical Exam   General Appearance: no distress   Skin: 1 X 1 mm area of redness at the medial end of the incision for the PPM, otherwise fine  Neck Veins: normal JVP , neck veins are not distended   Thyroid: no nodules, masses, tenderness or enlargement   Chest Inspection: chest is normal in appearance   Respiratory Effort: breathing comfortably, no respiratory distress   Auscultation/Percussion: lungs clear to auscultation, no rales or rhonchi, no wheezing   PMI: PMI not enlarged or displaced   Cardiac Rhythm: regular rhythm and normal rate   Cardiac Auscultation: S1, S2 normal, no rub, no gallop   Murmurs: no murmur   Peripheral Circulation: normal peripheral circulation   Carotid Arteries: normal carotid upstroke bilaterally, no bruits   Radial Arteries: normal symmetric radial pulses   Abdominal Aorta: no abdominal aortic bruit   Pedal Pulses: normal symmetric pedal pulses   Lower Extremity Edema: no lower extremity edema   Abdominal Exam: soft, non-tender, no masses, bowel sounds normal         Cardiovascular Health Factors  Vitals BP Readings from Last 3 Encounters:   05/15/21 118/60   04/17/21 104/64   03/10/21 124/80     Wt Readings from Last 3 Encounters:   05/15/21 83.7 kg (184 lb 9.6 oz)   05/08/21 84.5 kg (186 lb 3.2 oz)   04/17/21 85.2 kg (187 lb 12.8 oz)     BMI Readings from Last 3 Encounters:   05/15/21 28.91 kg/m?   05/08/21 29.16 kg/m?   04/17/21 29.41 kg/m?      Smoking Social History     Tobacco Use   Smoking Status Never Smoker   Smokeless Tobacco Never Used      Lipid Profile Cholesterol   Date Value Ref Range Status   01/10/2021 151  Final     HDL   Date Value Ref Range Status   01/10/2021 33 (L) >=40 Final     LDL   Date Value Ref Range Status   01/10/2021 75  Final     Triglycerides   Date Value Ref Range Status   01/10/2021 215 (H) <150 Final      Blood Sugar Hemoglobin A1C   Date Value Ref Range Status   07/13/2020 5.8 4.0 - 6.0 % Final     Comment:     The ADA recommends that most patients with type 1 and type 2 diabetes maintain   an A1c level <7%.       Glucose   Date Value Ref Range Status   02/28/2021 102  Final   01/10/2021 98  Final   07/15/2020 101 (H) 70 - 100 MG/DL Final   16/09/9603 93 70 - 110 MG/DL Final   54/08/8118 147 70 - 110 MG/DL Final   82/95/6213 97 70 - 110 MG/DL Final     Glucose, POC   Date Value Ref Range Status   04/05/2014 101 (H) 70 - 100 MG/DL Final          Problems Addressed Today  Encounter Diagnoses   Name Primary?   ? Pacemaker        Assessment and Plan       Pacemaker  He seems to be responding well to oral antibiotics.  I thought we should continue the Keflex for a total of a 2-week course so I sent another prescription in today.  We left that that if the incision looks fine at the end of the 14-day course  of antibiotics he does not need to necessarily check in with [place] but if there is any question he should give [place] a call and we can take a look at the incision.      Current Medications (including today's revisions)  ? acetaminophen/diphenhydramine (TYLENOL PM PO) Take 325 mg by mouth at bedtime daily.   ? aspirin EC 81 mg tablet Take 1 Tab by mouth daily.   ? atorvastatin (LIPITOR) 80 mg tablet Take one tablet by mouth daily.   ? cephalexin (KEFLEX) 500 mg capsule Take one capsule by mouth four times daily.   ? clopiDOGrel (PLAVIX) 75 mg tablet Take one tablet by mouth daily.   ? hydrOXYzine HCL (ATARAX) 25 mg tablet Take one tablet by mouth three times daily as needed. Indications: anxious   ? lisinopril (PRINIVIL) 10 mg tablet Take one tablet by mouth daily.   ? loratadine (CLARITIN) 10 mg PO tablet Take 10 mg by mouth Daily.   ? midodrine (PROAMATINE) 2.5 mg tablet Take one tablet by mouth every 4 hours while awake. Indications: a feeling of dizziness upon standing due to a drop in blood pressure (Patient taking differently: Take 2.5 mg by mouth as Needed. Indications: a feeling of dizziness upon standing due to a drop in blood pressure)   ? nitroglycerin (NITROSTAT) 0.4 mg tablet Place one tablet under tongue every 5 minutes as needed for Chest Pain.   ? pantoprazole DR (PROTONIX) 20 mg tablet Take one tablet by mouth daily. (Patient taking differently: Take 20 mg by mouth as Needed.)   ? prednisone (DELTASONE) 1 mg tablet Take 3-4 mg by mouth daily with breakfast.   ? sotaloL (BETAPACE) 120 mg tab TAKE 1 TABLET BY MOUTH TWICE A DAY   ? tamsulosin (FLOMAX) 0.4 mg capsule Take 0.4 mg by mouth daily. Do not crush, chew or open capsules. Take 30 minutes following the same meal each day.     Total time spent on today's office visit was 20 minutes.  This includes face-to-face in person visit with patient as well as nonface-to-face time including review of the EMR, outside records, labs, radiologic studies, echocardiogram & other cardiovascular studies, formation of treatment plan, after visit summary, future disposition, and lastly on documentation.

## 2021-05-15 NOTE — Assessment & Plan Note
He seems to be responding well to oral antibiotics.  I thought we should continue the Keflex for a total of a 2-week course so I sent another prescription in today.  We left that that if the incision looks fine at the end of the 14-day course of antibiotics he does not need to necessarily check in with [place] but if there is any question he should give [place] a call and we can take a look at the incision.

## 2021-06-06 ENCOUNTER — Encounter: Admit: 2021-06-06 | Discharge: 2021-06-06 | Payer: MEDICARE

## 2021-06-06 MED ORDER — LISINOPRIL 10 MG PO TAB
ORAL_TABLET | Freq: Every day | 3 refills | Status: AC
Start: 2021-06-06 — End: ?

## 2021-06-08 ENCOUNTER — Encounter: Admit: 2021-06-08 | Discharge: 2021-06-08 | Payer: MEDICARE

## 2021-06-08 MED ORDER — SOTALOL 120 MG PO TAB
ORAL_TABLET | Freq: Two times a day (BID) | 3 refills
Start: 2021-06-08 — End: ?

## 2021-06-09 ENCOUNTER — Encounter: Admit: 2021-06-09 | Discharge: 2021-06-09 | Payer: MEDICARE

## 2021-06-09 DIAGNOSIS — Z950 Presence of cardiac pacemaker: Secondary | ICD-10-CM

## 2021-06-10 ENCOUNTER — Encounter: Admit: 2021-06-10 | Discharge: 2021-06-10 | Payer: MEDICARE

## 2021-06-10 NOTE — Telephone Encounter
-----   Message from Cheri Guppy, RN sent at 06/09/2021  2:42 PM CDT -----  Regarding: MV vector trigger change on 03/31/21 seen on remote, please update RCP  # minute ventilation vector self changed 03/31/21 # 15:12 with clear noise seen on the leads.   Will message [facility] to see what the rep recommends for resolution next week.     TY

## 2021-07-14 ENCOUNTER — Encounter: Admit: 2021-07-14 | Discharge: 2021-07-14 | Payer: MEDICARE

## 2021-09-21 ENCOUNTER — Encounter: Admit: 2021-09-21 | Discharge: 2021-09-21 | Payer: MEDICARE

## 2021-09-21 DIAGNOSIS — E785 Hyperlipidemia, unspecified: Secondary | ICD-10-CM

## 2021-09-21 DIAGNOSIS — I10 Essential (primary) hypertension: Secondary | ICD-10-CM

## 2021-09-21 DIAGNOSIS — I480 Paroxysmal atrial fibrillation: Secondary | ICD-10-CM

## 2021-09-21 DIAGNOSIS — I2510 Atherosclerotic heart disease of native coronary artery without angina pectoris: Secondary | ICD-10-CM

## 2021-09-21 MED ORDER — CLOPIDOGREL 75 MG PO TAB
ORAL_TABLET | Freq: Every day | 3 refills
Start: 2021-09-21 — End: ?

## 2021-09-29 ENCOUNTER — Encounter: Admit: 2021-09-29 | Discharge: 2021-09-29 | Payer: MEDICARE

## 2021-09-29 MED ORDER — ATORVASTATIN 80 MG PO TAB
ORAL_TABLET | Freq: Every day | 3 refills | Status: AC
Start: 2021-09-29 — End: ?

## 2021-10-13 ENCOUNTER — Encounter: Admit: 2021-10-13 | Discharge: 2021-10-13 | Payer: MEDICARE

## 2022-01-12 ENCOUNTER — Encounter: Admit: 2022-01-12 | Discharge: 2022-01-12 | Payer: MEDICARE

## 2022-01-12 NOTE — Telephone Encounter
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 Patient current medication list shows patient is not currently taking OAC. AF noted on patient's problems list. AT/AF Burden: 1%. Total number of events: 2. Episode date: Latest episode 11/11/21. Longest episode: >6 seconds on 11/09/22, no true onset noted and event duration not recorded by device. Avg V rate: 118bpm. V rate during AT/AF histrogram shows VP 100% @ 100's-110'sbpm. Patient's remote also shows 1 event with EGMs suggestive of NSVT. Episode date: 01/08/22. Episode length: 10 beats (~3 seconds). Avg V rate: 138-203bpm. Patient has pacemaker. Please see Remote PPM note from 01/12/22 for further details.     Thank you,    Paden

## 2022-01-12 NOTE — Telephone Encounter
Called pt to discuss.  He states that he is feeling fine.  He has not noticed any symptoms of palpitations, chest pain, or shortness of breath.  He is taking his medications as prescribed.      Will route to Dr. Barry Dienes for recommendations.

## 2022-04-15 ENCOUNTER — Encounter: Admit: 2022-04-15 | Discharge: 2022-04-15 | Payer: MEDICARE

## 2022-04-15 DIAGNOSIS — Z950 Presence of cardiac pacemaker: Secondary | ICD-10-CM

## 2022-04-15 DIAGNOSIS — I480 Paroxysmal atrial fibrillation: Secondary | ICD-10-CM

## 2022-05-18 ENCOUNTER — Ambulatory Visit: Admit: 2022-05-18 | Discharge: 2022-05-18 | Payer: MEDICARE

## 2022-05-18 ENCOUNTER — Encounter: Admit: 2022-05-18 | Discharge: 2022-05-18 | Payer: MEDICARE

## 2022-05-18 DIAGNOSIS — R079 Chest pain, unspecified: Secondary | ICD-10-CM

## 2022-05-18 DIAGNOSIS — N179 Acute kidney failure, unspecified: Secondary | ICD-10-CM

## 2022-05-18 DIAGNOSIS — K5792 Diverticulitis of intestine, part unspecified, without perforation or abscess without bleeding: Secondary | ICD-10-CM

## 2022-05-18 DIAGNOSIS — I482 Chronic atrial fibrillation, unspecified: Secondary | ICD-10-CM

## 2022-05-19 ENCOUNTER — Encounter: Admit: 2022-05-19 | Discharge: 2022-05-19 | Payer: MEDICARE

## 2022-05-19 ENCOUNTER — Ambulatory Visit: Admit: 2022-05-19 | Discharge: 2022-05-19 | Payer: MEDICARE

## 2022-05-19 DIAGNOSIS — I4891 Unspecified atrial fibrillation: Secondary | ICD-10-CM

## 2022-05-19 DIAGNOSIS — R079 Chest pain, unspecified: Secondary | ICD-10-CM

## 2022-06-01 ENCOUNTER — Encounter: Admit: 2022-06-01 | Discharge: 2022-06-01 | Payer: MEDICARE

## 2022-06-01 MED ORDER — LISINOPRIL 10 MG PO TAB
ORAL_TABLET | 3 refills | Status: AC
Start: 2022-06-01 — End: ?

## 2022-06-01 MED ORDER — SOTALOL 120 MG PO TAB
ORAL_TABLET | ORAL | 3 refills | 30.00000 days | Status: AC
Start: 2022-06-01 — End: ?

## 2022-06-10 ENCOUNTER — Encounter: Admit: 2022-06-10 | Discharge: 2022-06-10 | Payer: MEDICARE

## 2022-07-15 ENCOUNTER — Encounter: Admit: 2022-07-15 | Discharge: 2022-07-15 | Payer: MEDICARE

## 2022-07-15 DIAGNOSIS — Z950 Presence of cardiac pacemaker: Secondary | ICD-10-CM

## 2022-08-20 ENCOUNTER — Encounter: Admit: 2022-08-20 | Discharge: 2022-08-20 | Payer: MEDICARE

## 2022-08-20 ENCOUNTER — Ambulatory Visit: Admit: 2022-08-20 | Discharge: 2022-08-20 | Payer: MEDICARE

## 2022-08-20 DIAGNOSIS — Z950 Presence of cardiac pacemaker: Secondary | ICD-10-CM

## 2022-09-14 ENCOUNTER — Encounter: Admit: 2022-09-14 | Discharge: 2022-09-14 | Payer: MEDICARE

## 2022-09-18 ENCOUNTER — Encounter: Admit: 2022-09-18 | Discharge: 2022-09-18 | Payer: MEDICARE

## 2022-09-18 DIAGNOSIS — I480 Paroxysmal atrial fibrillation: Secondary | ICD-10-CM

## 2022-09-18 DIAGNOSIS — E785 Hyperlipidemia, unspecified: Secondary | ICD-10-CM

## 2022-09-18 DIAGNOSIS — I2510 Atherosclerotic heart disease of native coronary artery without angina pectoris: Secondary | ICD-10-CM

## 2022-09-18 DIAGNOSIS — I10 Essential (primary) hypertension: Secondary | ICD-10-CM

## 2022-09-18 MED ORDER — CLOPIDOGREL 75 MG PO TAB
ORAL_TABLET | ORAL | 0 refills | 90.00000 days | Status: AC
Start: 2022-09-18 — End: ?

## 2022-10-04 ENCOUNTER — Encounter: Admit: 2022-10-04 | Discharge: 2022-10-04 | Payer: MEDICARE

## 2022-10-04 MED ORDER — ATORVASTATIN 80 MG PO TAB
ORAL_TABLET | 3 refills
Start: 2022-10-04 — End: ?

## 2022-10-06 ENCOUNTER — Encounter: Admit: 2022-10-06 | Discharge: 2022-10-06 | Payer: MEDICARE

## 2022-10-21 ENCOUNTER — Encounter: Admit: 2022-10-21 | Discharge: 2022-10-21 | Payer: MEDICARE

## 2022-12-08 ENCOUNTER — Encounter: Admit: 2022-12-08 | Discharge: 2022-12-08 | Payer: MEDICARE

## 2022-12-08 DIAGNOSIS — Z950 Presence of cardiac pacemaker: Secondary | ICD-10-CM

## 2022-12-08 DIAGNOSIS — I4891 Unspecified atrial fibrillation: Secondary | ICD-10-CM

## 2022-12-08 DIAGNOSIS — I2510 Atherosclerotic heart disease of native coronary artery without angina pectoris: Secondary | ICD-10-CM

## 2022-12-08 DIAGNOSIS — I10 Essential (primary) hypertension: Secondary | ICD-10-CM

## 2022-12-08 DIAGNOSIS — I480 Paroxysmal atrial fibrillation: Secondary | ICD-10-CM

## 2022-12-08 DIAGNOSIS — E785 Hyperlipidemia, unspecified: Secondary | ICD-10-CM

## 2022-12-08 DIAGNOSIS — I495 Sick sinus syndrome: Secondary | ICD-10-CM

## 2022-12-08 MED ORDER — SOTALOL 120 MG PO TAB
120 mg | ORAL_TABLET | Freq: Two times a day (BID) | ORAL | 3 refills | 30.00000 days | Status: AC
Start: 2022-12-08 — End: ?

## 2022-12-08 NOTE — Progress Notes
Date of Service: 12/08/2022    Joshua Keller is a 82 y.o. male.       HPI     Joshua [place] was in the [place] clinic today for follow-up regarding his pacemaker, coronary disease, and risk factors.      Unfortunately his wife's health is not very good and it limits their ability to travel.  He hopes they can attend a family wedding out in [place] in February and he would really like to ski but says that his wife probably will not let him.    He feels good and denies any problems with lightheadedness or palpitations.  He has had no chest discomfort or breathlessness.           Vitals:    12/08/22 0827   BP: 118/60   BP Source: Arm, Left Upper   Pulse: 66   SpO2: 96%   O2 Device: None (Room air)   PainSc: Zero   Weight: 85.3 kg (188 lb)   Height: 170.2 cm (5' 7)     Body mass index is 29.44 kg/m?[name]     Past Medical History  Patient Active Problem List    Diagnosis Date Noted    Screening for cardiovascular condition 05/27/2017     11/2016 - Abdominal Duplex:  1.8 cm mid-abdominal aorta.  No aneurysm.      Left-sided weakness 04/05/2014    S/P drug eluting coronary stent placement 01/03/2014    PUD (peptic ulcer disease) 01/01/2014    Convulsion (HCC) 06/20/2013     06/05/2013 - 90-second episode of tonic-clonic motor activity and unresponsiveness.  Non-contrast CT-head in [facility] ED negative.  03/2014 - Episode of left leg weakness.  Hospitalized overnight on Neurology service at [facility].  EEG, CT-head negative.  Attributed to focal seizure.  No new meds.      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  Moderate right external carotid stenosis.  Normal vertebral arteries.    11/2016 - Carotid Duplex:  50-79% (133) RICA, 0-49% (102) LICA.        CAD (coronary artery disease) 09/10/2009     3/94 - Inferior MI.        3/94 - CABG x 4, [facility], Dr. Lennart Pall: LIMA-LAD, SSVG-2 sites of OMB,                  SVG-RCA.        10/98 - EXEC: EF 55%, mild anteroseptal hypokinesis improves with stress, non-ischemic.        4/99 - EXEC: EF 55%, non-ischemic study.        3/01 - EXEC: EF 55-60%, non-ischemic study.        5/02 - Cardiac cath, : LIMA-LAD patent, SVG-RCA patent, sequential graft-OM1 and                  OM2 patent, 50% plaque in graft to RCA (not obstructed), native RCA, LAD and LCX                   totally occluded, EF 50-55%        1/04 - Stress thallium (in-pt): EF 66%, low probability for exercise-induced ischemia.        1/06 - Cardiac cath: definite progression of CAD in SVG to RCA with focal 70-80%                   stenosis  in mid portion of graft. Native RCA totally occluded. SVG to two Cx                   marginal branches widely patent, as was LIMA to LAD. LVEF 55%.  PCI/stent                   to SVG to RCA (bare metal stent).   12/2013 -    DES to native, distal RCA beyond the insertion of vein graft [name]).  Other finding:  SVG patent to the OM 1 and 2 with a new eccentric 60% to 70%                       stenosis in the distal graft just before the 1st anastomosis. Our recommendation is to manage this conservatively.  07/13/20 Echo: Left Ventricle: EF 55%. The left ventricular size is normal. Concentric remodeling. There are segmental wall motion abnormalities, as coded in the diagram below (new compared to 03/30/2013 study). Abnormal septal motion consistent with post-operative state. Left ventricular diastolic dysfunction. Right Ventricle: The right ventricular size is normal. The right ventricular systolic function is mildly reduced. No hemodynamically significant valvular abnormalities.  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 80% to 90% stenosis in the mid saphenous vein graft in sequence to obtuse marginal 1 and obtuse marginal 2, which was thought to be the culprit lesion, with placement of a 4.0 x 28 mm Xience Skypoint stent that was post dilated with a 4.5 NC balloon. Patent LIMA to LAD graft, patent saphenous vein graft to right coronary artery graft with a patent stent in its midportion. Patent saphenous vein graft-obtuse marginal graft, status post percutaneous coronary intervention to the 80% to 90% stenosis in its mid segment. Low left ventricular end-diastolic pressure.        Hypertension 09/10/2009    Hyperlipidemia 09/10/2009     3/01 - Total 165, trig 139, HDL 39, LDL 98; Baycol 0.4mg .         1/02 - Total 228, trig 144, HDL 43, LDL 155; no meds.      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        1/04 - Transfer [facility] d/t recurrence of AF and chest pressure. Cardizem d/c'd d/t                  bradycardia after conversion to NSR.         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 Toprol XL 100mg  initiated.      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   HENT: Negative.     Eyes: Negative.    Cardiovascular: Negative.    Respiratory: Negative.     Endocrine: Negative.    Hematologic/Lymphatic: Negative.    Skin: Negative.    Musculoskeletal: Negative.    Gastrointestinal: Negative.    Genitourinary: Negative.    Neurological: Negative.    Psychiatric/Behavioral: Negative.     Allergic/Immunologic: Negative.  Physical Exam    Physical Exam   General Appearance: no distress   Skin: warm, no ulcers or xanthomas   Digits and Nails: no cyanosis or clubbing   Eyes: conjunctivae and lids normal, pupils are equal and round   Teeth/Gums/Palate: dentition unremarkable, no lesions   Lips & Oral Mucosa: no pallor or cyanosis   Neck Veins: normal JVP , neck veins are not distended   Thyroid: no nodules, masses, tenderness or enlargement   Chest Inspection: chest is normal in appearance   Respiratory Effort: breathing comfortably, no respiratory distress   Auscultation/Percussion: lungs clear to auscultation, no rales or rhonchi, no wheezing   PMI: PMI not enlarged or displaced   Cardiac Rhythm: regular rhythm and normal rate   Cardiac Auscultation: S1, S2 normal, no rub, no gallop   Murmurs: no murmur   Peripheral Circulation: normal peripheral circulation   Carotid Arteries: normal carotid upstroke bilaterally, no bruits   Radial Arteries: normal symmetric radial pulses   Abdominal Aorta: no abdominal aortic bruit   Pedal Pulses: normal symmetric pedal pulses   Lower Extremity Edema: no lower extremity edema   Abdominal Exam: soft, non-tender, no masses, bowel sounds normal   Liver & Spleen: no organomegaly   Gait & Station: walks without assistance   Muscle Strength: normal muscle tone   Orientation: oriented to time, place and person   Affect & Mood: appropriate and sustained affect   Language and Memory: patient responsive and seems to comprehend information   Neurologic Exam: neurological assessment grossly intact   Other: moves all extremities      Cardiovascular Studies    EKG:  Atrial-paced rhythm, rate 81.  RBBB.  QTc 480 msec.    Cardiovascular Health Factors  Vitals BP Readings from Last 3 Encounters:   12/08/22 118/60   05/18/22 134/76   05/15/21 118/60     Wt Readings from Last 3 Encounters:   12/08/22 85.3 kg (188 lb)   05/18/22 86 kg (189 lb 9.5 oz)   05/15/21 83.7 kg (184 lb 9.6 oz)     BMI Readings from Last 3 Encounters:   12/08/22 29.44 kg/m?   05/18/22 29.69 kg/m?   05/15/21 28.91 kg/m?      Smoking Social History     Tobacco Use   Smoking Status Never   Smokeless Tobacco Never      Lipid Profile Cholesterol   Date Value Ref Range Status   01/10/2021 151  Final     HDL   Date Value Ref Range Status   01/10/2021 33 (L) >=40 Final     LDL   Date Value Ref Range Status   01/10/2021 75  Final     Triglycerides   Date Value Ref Range Status   01/10/2021 215 (H) <150 Final      Blood Sugar Hemoglobin A1C   Date Value Ref Range Status   07/13/2020 5.8 4.0 - 6.0 % Final     Comment:     The ADA recommends that most patients with type 1 and type 2 diabetes maintain   an A1c level <7%.       Glucose   Date Value Ref Range Status   05/19/2022 99  Final   02/28/2021 102  Final   01/10/2021 98  Final   12/12/2004 93 70 - 110 MG/DL Final   16/09/9603 540 70 - 110 MG/DL Final   98/10/9146 97 70 - 110 MG/DL Final     Glucose, POC  Date Value Ref Range Status   04/05/2014 101 (H) 70 - 100 MG/DL Final          Problems Addressed Today  Encounter Diagnoses   Name Primary?    Primary hypertension Yes    Sick sinus syndrome (HCC)     Coronary artery disease involving native coronary artery of native heart without angina pectoris     Paroxysmal atrial fibrillation (HCC)     Essential hypertension     Hyperlipidemia, unspecified hyperlipidemia type     Pacemaker        Assessment and Plan       Atrial fibrillation (HCC)  The QTc on today's EKG is about 480 ms which is within the acceptable range.  His device checked in [place] showed no recurrence of atrial fibrillation on sotalol.  He does not take oral anticoagulation and as long as his burden is quite low I am planning to continue aspirin.    CAD (coronary artery disease)  He completed a nonischemic stress test about 6 months ago and has no angina symptoms.    Hyperlipidemia  I have ordered an updated lipid profile and chemistry profile today.    Hypertension  He is going to try to discontinue the lisinopril.  He will keep a daily blood pressure log at home and as long as the average is less than 130/80 I think it is okay for him to stay off the lisinopril.    Pacemaker  The generator was replaced last year.  He had a device check in [place].  He essentially paces his atria 100%.  The device is functioning normally.      Current Medications (including today's revisions)   acetaminophen/diphenhydramine (TYLENOL PM PO) Take 325 mg by mouth at bedtime daily.    aspirin EC 81 mg tablet Take 1 Tab by mouth daily.    atorvastatin (LIPITOR) 80 mg tablet TAKE 1 TABLET BY MOUTH EVERY DAY    loratadine (CLARITIN) 10 mg PO tablet Take one tablet by mouth daily.    nitroglycerin (NITROSTAT) 0.4 mg tablet Place one tablet under tongue every 5 minutes as needed for Chest Pain.    prednisone (DELTASONE) 1 mg tablet Take three tablets to four tablets by mouth daily with breakfast.    sotaloL (BETAPACE) 120 mg tablet Take one tablet by mouth twice daily.    tamsulosin (FLOMAX) 0.4 mg capsule Take one capsule by mouth daily. Do not crush, chew or open capsules. Take 30 minutes following the same meal each day.     Total time spent on today's office visit was 45 minutes.  This includes face-to-face in person visit with patient as well as nonface-to-face time including review of the EMR, outside records, labs, radiologic studies, echocardiogram & other cardiovascular studies, formation of treatment plan, after visit summary, future disposition, and lastly on documentation.[name]

## 2022-12-08 NOTE — Assessment & Plan Note
The generator was replaced last year.  He had a device check in November.  He essentially paces his atria 100%.  The device is functioning normally.

## 2022-12-08 NOTE — Assessment & Plan Note
I have ordered an updated lipid profile and chemistry profile today.

## 2022-12-08 NOTE — Assessment & Plan Note
He completed a nonischemic stress test about 6 months ago and has no angina symptoms.

## 2022-12-08 NOTE — Assessment & Plan Note
He is going to try to discontinue the lisinopril.  He will keep a daily blood pressure log at home and as long as the average is less than 130/80 I think it is okay for him to stay off the lisinopril.

## 2022-12-08 NOTE — Assessment & Plan Note
The QTc on today's EKG is about 480 ms which is within the acceptable range.  His device checked in November showed no recurrence of atrial fibrillation on sotalol.  He does not take oral anticoagulation and as long as his burden is quite low I am planning to continue aspirin.

## 2022-12-08 NOTE — Patient Instructions
Goal resting home BP is 130/80 or less on average.  Keep a daily BP log and we'll call in 2 weeks.

## 2022-12-14 ENCOUNTER — Encounter: Admit: 2022-12-14 | Discharge: 2022-12-14 | Payer: MEDICARE

## 2022-12-14 DIAGNOSIS — I2510 Atherosclerotic heart disease of native coronary artery without angina pectoris: Secondary | ICD-10-CM

## 2022-12-14 LAB — LIPID PROFILE
CHOLESTEROL/HDL %: 4
CHOLESTEROL: 131
HDL: 34 — ABNORMAL LOW (ref >=40)
LDL: 69
TRIGLYCERIDES: 143 — ABNORMAL HIGH (ref 98–107)
VLDL: 29 — ABNORMAL HIGH (ref 0.72–1.25)

## 2022-12-14 LAB — COMPREHENSIVE METABOLIC PANEL
ALBUMIN: 3.9
ALK PHOSPHATASE: 56
ALT: 12
ANION GAP: 8
AST: 15
SODIUM: 142
TOTAL BILIRUBIN: 0.5
TOTAL PROTEIN: 6.2

## 2023-01-12 ENCOUNTER — Encounter: Admit: 2023-01-12 | Discharge: 2023-01-12 | Payer: MEDICARE

## 2023-01-19 ENCOUNTER — Encounter: Admit: 2023-01-19 | Discharge: 2023-01-19 | Payer: MEDICARE

## 2023-04-27 ENCOUNTER — Encounter: Admit: 2023-04-27 | Discharge: 2023-04-27 | Payer: MEDICARE

## 2023-06-30 NOTE — Assessment & Plan Note
Lab Results   Component Value Date    CHOL 131 12/14/2022    TRIG 143 12/14/2022    HDL 34 (L) 12/14/2022    LDL 69 12/14/2022    VLDL 29 12/14/2022    NONHDLCHOL 106 07/12/2020    CHOLHDLC 4 12/14/2022      LDL treated to goal.

## 2023-06-30 NOTE — Assessment & Plan Note
Last device check in mid-May showed 3 brief episodes of atrial tachycardia, the longest 29 seconds in duration.  Sotalol, no anti-coagulation with very low AF burden.

## 2023-06-30 NOTE — Assessment & Plan Note
Last device check was in May--normal device function.

## 2023-06-30 NOTE — Assessment & Plan Note
Last stress test was regadeoson thallium in June, 2023, that was essentially normal.

## 2023-06-30 NOTE — Assessment & Plan Note
He stopped lisinopril in January, 2024.  No anti-hypertensive medications today.

## 2023-07-01 ENCOUNTER — Ambulatory Visit: Admit: 2023-07-01 | Discharge: 2023-07-02 | Payer: MEDICARE

## 2023-07-01 ENCOUNTER — Encounter: Admit: 2023-07-01 | Discharge: 2023-07-01 | Payer: MEDICARE

## 2023-07-01 DIAGNOSIS — I2510 Atherosclerotic heart disease of native coronary artery without angina pectoris: Secondary | ICD-10-CM

## 2023-07-01 DIAGNOSIS — R0989 Other specified symptoms and signs involving the circulatory and respiratory systems: Secondary | ICD-10-CM

## 2023-07-01 DIAGNOSIS — I10 Essential (primary) hypertension: Secondary | ICD-10-CM

## 2023-07-01 DIAGNOSIS — Z950 Presence of cardiac pacemaker: Secondary | ICD-10-CM

## 2023-07-01 DIAGNOSIS — E785 Hyperlipidemia, unspecified: Secondary | ICD-10-CM

## 2023-07-01 DIAGNOSIS — I4891 Unspecified atrial fibrillation: Secondary | ICD-10-CM

## 2023-07-01 DIAGNOSIS — I480 Paroxysmal atrial fibrillation: Secondary | ICD-10-CM

## 2023-07-01 NOTE — Progress Notes
Date of Service: 07/01/2023    Joshua Keller is a 82 y.o. male.       HPI     Joshua Keller was in the [facility] today for follow-up regarding his pacemaker, coronary disease, and risk factors.  He's playing golf 3 days/week.  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     Unfortunately his wife's health is not very good and it limits their ability to travel.   They went to [place] for a family wedding in February and plan to go to [place] for another wedding next month.    He feels good and denies any problems with lightheadedness or palpitations.  He has had no chest discomfort or breathlessness.         Vitals:    07/01/23 0927   BP: 137/78   BP Source: Arm, Left Upper   Pulse: 81   SpO2: 95%   O2 Device: None (Room air)   PainSc: Four   Weight: 85.9 kg (189 lb 6.4 oz)   Height: 170.2 cm (5' 7)     Body mass index is 29.66 kg/m?Joshua Keller     Past Medical History  Patient Active Problem List    Diagnosis Date Noted    Screening for cardiovascular condition 05/27/2017     11/2016 - Abdominal Duplex:  1.8 cm mid-abdominal aorta.  No aneurysm.      Left-sided weakness 04/05/2014    S/P drug eluting coronary stent placement 01/03/2014    PUD (peptic ulcer disease) 01/01/2014    Convulsion (HCC) 06/20/2013     06/05/2013 - 90-second episode of tonic-clonic motor activity and unresponsiveness.  Non-contrast CT-head in [facility] ED negative.  03/2014 - Episode of left leg weakness.  Hospitalized overnight on Neurology service at [facility].  EEG, CT-head negative.  Attributed to focal seizure.  No new meds.      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  Moderate right external carotid stenosis.  Normal vertebral arteries.    11/2016 - Carotid Duplex:  50-79% (133) RICA, 0-49% (102) LICA.        CAD (coronary artery disease) 09/10/2009     3/94 - Inferior MI.        3/94 - CABG x 4, [facility], Dr. Lennart Pall: LIMA-LAD, SSVG-2 sites of OMB,                  SVG-RCA.        10/98 - EXEC: EF 55%, mild anteroseptal hypokinesis improves with stress, non-ischemic.        4/99 - EXEC: EF 55%, non-ischemic study.        3/01 - EXEC: EF 55-60%, non-ischemic study.        5/02 - Cardiac cath, [facility]: LIMA-LAD patent, SVG-RCA patent, sequential graft-OM1 and                  OM2 patent, 50% plaque in graft to RCA (not obstructed), native RCA, LAD and LCX                   totally occluded, EF 50-55%        1/04 - Stress thallium (in-pt): EF 66%, low probability for exercise-induced ischemia.        1/06 - Cardiac cath: definite progression of CAD in SVG to RCA with focal 70-80%  stenosis in mid portion of graft. Native RCA totally occluded. SVG to two Cx                   marginal branches widely patent, as was LIMA to LAD. LVEF 55%.  PCI/stent                   to SVG to RCA (bare metal stent).   12/2013 -    DES to native, distal RCA beyond the insertion of vein graft Chales Keller).  Other finding:  SVG patent to the OM 1 and 2 with a new eccentric 60% to 70%                       stenosis in the distal graft just before the 1st anastomosis. Our recommendation is to manage this conservatively.  07/13/20 Echo: Left Ventricle: EF 55%. The left ventricular size is normal. Concentric remodeling. There are segmental wall motion abnormalities, as coded in the diagram below (new compared to 03/30/2013 study). Abnormal septal motion consistent with post-operative state. Left ventricular diastolic dysfunction. Right Ventricle: The right ventricular size is normal. The right ventricular systolic function is mildly reduced. No hemodynamically significant valvular abnormalities.  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 80% to 90% stenosis in the mid saphenous vein graft in sequence to obtuse marginal 1 and obtuse marginal 2, which was thought to be the culprit lesion, with placement of a 4.0 x 28 mm Xience Skypoint stent that was post dilated with a 4.5 NC balloon. Patent LIMA to LAD graft, patent saphenous vein graft to right coronary artery graft with a patent stent in its midportion. Patent saphenous vein graft-obtuse marginal graft, status post percutaneous coronary intervention to the 80% to 90% stenosis in its mid segment. Low left ventricular end-diastolic pressure.        Hypertension 09/10/2009    Hyperlipidemia 09/10/2009     3/01 - Total 165, trig 139, HDL 39, LDL 98; Baycol 0.4mg .         1/02 - Total 228, trig 144, HDL 43, LDL 155; no meds.      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        1/04 - Transfer [facility] d/t recurrence of AF and chest pressure. Cardizem d/c'd d/t                  bradycardia after conversion to NSR.         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 Toprol XL 100mg  initiated.      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   HENT: Negative.     Eyes: Negative.    Cardiovascular: Negative.    Respiratory: Negative.     Endocrine: Negative.    Hematologic/Lymphatic: Negative.    Skin: Negative.    Musculoskeletal:  Positive for joint pain.   Gastrointestinal: Negative.    Genitourinary: Negative.    Neurological: Negative.    Psychiatric/Behavioral: Negative.     Allergic/Immunologic: Negative.  Physical Exam    General Appearance: no distress   Skin: warm, no ulcers or xanthomas   Digits and Nails: no cyanosis or clubbing   Eyes: conjunctivae and lids normal, pupils are equal and round   Teeth/Gums/Palate: dentition unremarkable, no lesions   Lips & Oral Mucosa: no pallor or cyanosis   Neck Veins: normal JVP , neck veins are not distended Thyroid: no nodules, masses, tenderness or enlargement   Chest Inspection: chest is normal in appearance   Respiratory Effort: breathing comfortably, no respiratory distress   Auscultation/Percussion: lungs clear to auscultation, no rales or rhonchi, no wheezing   PMI: PMI not enlarged or displaced   Cardiac Rhythm: regular rhythm and normal rate   Cardiac Auscultation: S1, S2 normal, no rub, no gallop   Murmurs: no murmur   Peripheral Circulation: normal peripheral circulation   Carotid Arteries: normal carotid upstroke bilaterally, no bruits   Radial Arteries: normal symmetric radial pulses   Abdominal Aorta: no abdominal aortic bruit   Pedal Pulses: normal symmetric pedal pulses   Lower Extremity Edema: no lower extremity edema   Abdominal Exam: soft, non-tender, no masses, bowel sounds normal   Liver & Spleen: no organomegaly   Gait & Station: walks without assistance   Muscle Strength: normal muscle tone   Orientation: oriented to time, place and person   Affect & Mood: appropriate and sustained affect   Language and Memory: patient responsive and seems to comprehend information   Neurologic Exam: neurological assessment grossly intact   Other: moves all extremities      Cardiovascular Studies    EKG:  SR, rate 80.  QTc 466    Cardiovascular Health Factors  Vitals BP Readings from Last 3 Encounters:   07/01/23 137/78   12/08/22 118/60   05/18/22 134/76     Wt Readings from Last 3 Encounters:   07/01/23 85.9 kg (189 lb 6.4 oz)   12/08/22 85.3 kg (188 lb)   05/18/22 86 kg (189 lb 9.5 oz)     BMI Readings from Last 3 Encounters:   07/01/23 29.66 kg/m?   12/08/22 29.44 kg/m?   05/18/22 29.69 kg/m?      Smoking Social History     Tobacco Use   Smoking Status Never   Smokeless Tobacco Never      Lipid Profile Cholesterol   Date Value Ref Range Status   12/14/2022 131  Final     HDL   Date Value Ref Range Status   12/14/2022 34 (L) >=40 Final     LDL   Date Value Ref Range Status   12/14/2022 69  Final Triglycerides   Date Value Ref Range Status   12/14/2022 143  Final      Blood Sugar Hemoglobin A1C   Date Value Ref Range Status   07/13/2020 5.8 4.0 - 6.0 % Final     Comment:     The ADA recommends that most patients with type 1 and type 2 diabetes maintain   an A1c level <7%.       Glucose   Date Value Ref Range Status   12/14/2022 109 (H) 70 - 105 Final   05/19/2022 99  Final   02/28/2021 102  Final   12/12/2004 93 70 - 110 MG/DL Final   16/09/9603 540 70 - 110 MG/DL Final   98/10/9146 97 70 - 110 MG/DL Final     Glucose, POC   Date Value Ref Range Status   04/05/2014 101 (H) 70 -  100 MG/DL Final          Problems Addressed Today  Encounter Diagnoses   Name Primary?    Paroxysmal atrial fibrillation (HCC) Yes    Coronary artery disease involving native coronary artery of native heart without angina pectoris     Hyperlipidemia, unspecified hyperlipidemia type     Primary hypertension     Pacemaker     Cardiovascular symptoms        Assessment and Plan       Atrial fibrillation (HCC)  Last device check in mid-May showed 3 brief episodes of atrial tachycardia, the longest 29 seconds in duration.  Sotalol, no anti-coagulation with very low AF burden.    CAD (coronary artery disease)  Last stress test was regadeoson thallium in June, 2023, that was essentially normal.    Hyperlipidemia  Lab Results   Component Value Date    CHOL 131 12/14/2022    TRIG 143 12/14/2022    HDL 34 (L) 12/14/2022    LDL 69 12/14/2022    VLDL 29 12/14/2022    NONHDLCHOL 106 07/12/2020    CHOLHDLC 4 12/14/2022      LDL treated to goal.    Hypertension  He stopped lisinopril in January, 2024.  No anti-hypertensive medications today.    Pacemaker  Last device check was in May--normal device function.    Current Medications (including today's revisions)   acetaminophen/diphenhydramine (TYLENOL PM PO) Take 325 mg by mouth at bedtime daily.    aspirin EC 81 mg tablet Take 1 Tab by mouth daily.    atorvastatin (LIPITOR) 80 mg tablet TAKE 1 TABLET BY MOUTH EVERY DAY    loratadine (CLARITIN) 10 mg PO tablet Take one tablet by mouth daily.    nitroglycerin (NITROSTAT) 0.4 mg tablet Place one tablet under tongue every 5 minutes as needed for Chest Pain.    prednisone (DELTASONE) 1 mg tablet Take three tablets to four tablets by mouth daily with breakfast.    sotaloL (BETAPACE) 120 mg tablet Take one tablet by mouth twice daily.    tamsulosin (FLOMAX) 0.4 mg capsule Take one capsule by mouth daily. Do not crush, chew or open capsules. Take 30 minutes following the same meal each day.     Total time spent on today's office visit was 45 minutes.  This includes face-to-face in person visit with patient as well as nonface-to-face time including review of the EMR, outside records, labs, radiologic studies, echocardiogram & other cardiovascular studies, formation of treatment plan, after visit summary, future disposition, and lastly on documentation.

## 2023-07-09 ENCOUNTER — Encounter: Admit: 2023-07-09 | Discharge: 2023-07-09 | Payer: MEDICARE

## 2023-07-09 DIAGNOSIS — Z950 Presence of cardiac pacemaker: Secondary | ICD-10-CM

## 2023-07-09 DIAGNOSIS — I480 Paroxysmal atrial fibrillation: Secondary | ICD-10-CM

## 2023-07-28 ENCOUNTER — Encounter: Admit: 2023-07-28 | Discharge: 2023-07-28 | Payer: MEDICARE

## 2023-08-18 ENCOUNTER — Encounter: Admit: 2023-08-18 | Discharge: 2023-08-18 | Payer: MEDICARE

## 2023-08-18 NOTE — Telephone Encounter
Called and discussed with [name].  He states he is doing well from a cardiovascular standpoint.  He denies any chest pain, shortness of breath or palpitations.  He is able to do his daily activities without needing to stop and rest.      [name] was last seen in clinic in July by Dr. Barry Dienes. He had a echo and stress test in June 2023.  He had a remote transmission from his device on 8/21.    [name] is on Aspirin 81mg  daily for CAD risk reduction.    Will route to Dr. Barry Dienes for review and recommendations.

## 2023-10-01 IMAGING — CT ABDOMEN_PELVIS W(Adult)
2 of 3 series · 13 of 46 positions shown, 15 images · non-contrast
Comparison: none

[Series 2: abdomen_pelvis ax 3.00 br40 s3 · axial · 0.63mm/px · z∈[+1291,+1686]mm · 10 of 148 slices shown, 12 images]
[im 10/148  soft-tissue]
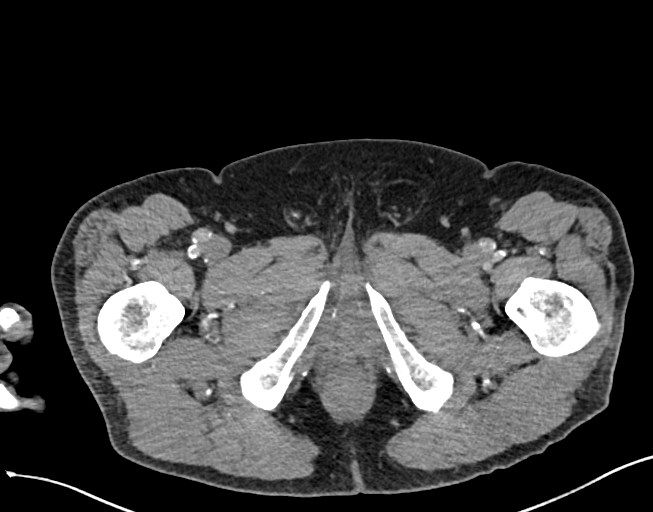
[im 10/148  bone]
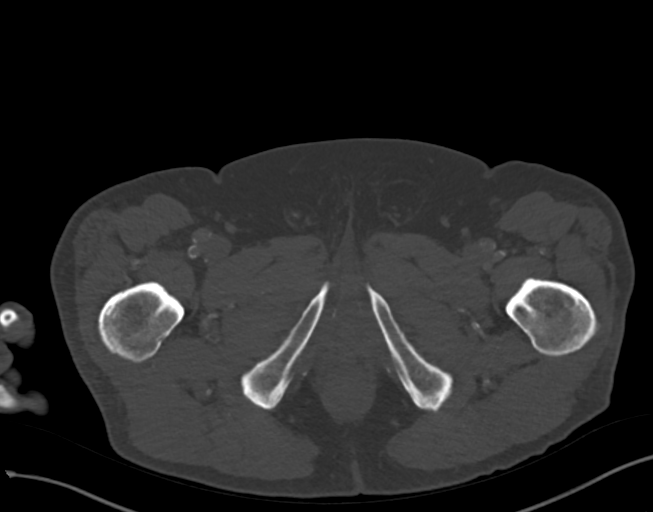
[im 24/148  soft-tissue]
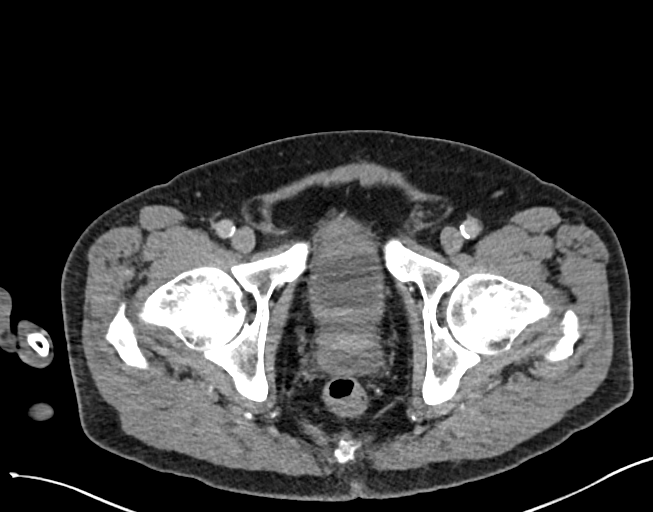
[im 38/148  soft-tissue]
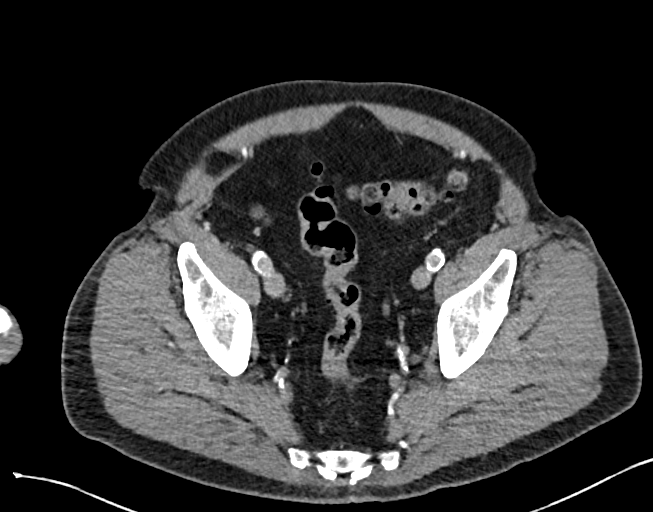
[im 53/148  soft-tissue]
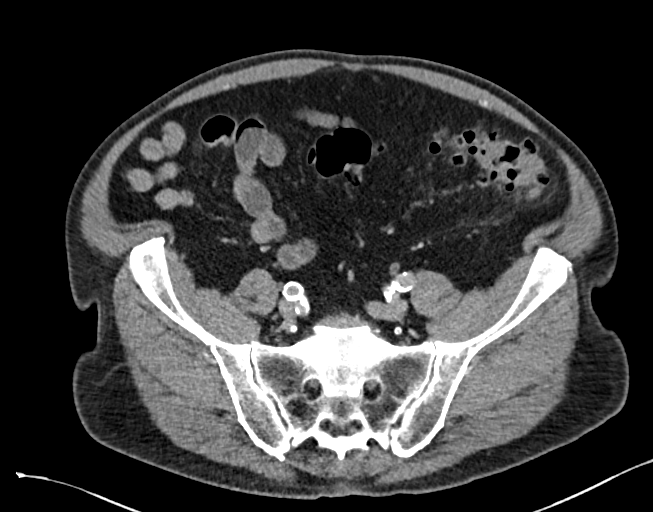
[im 67/148  soft-tissue]
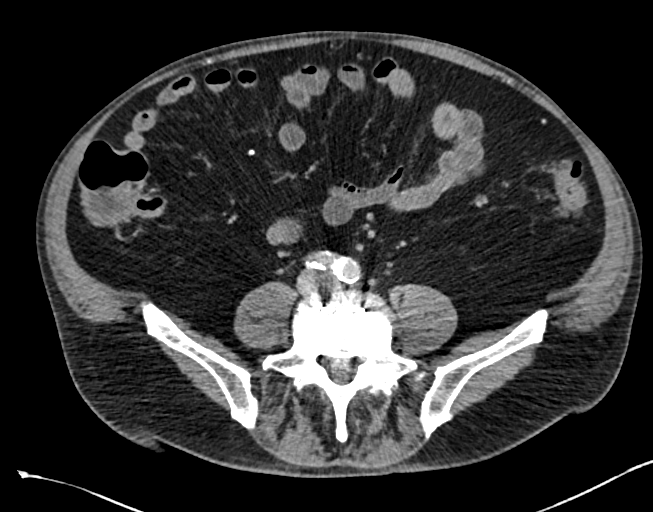
[im 81/148  soft-tissue]
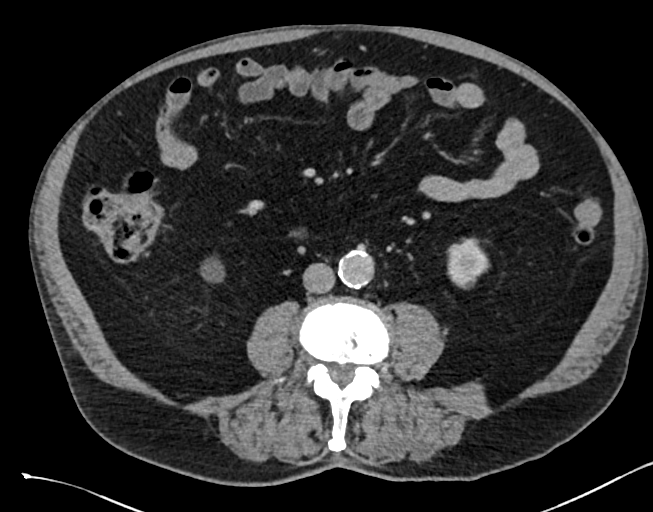
[im 95/148  soft-tissue]
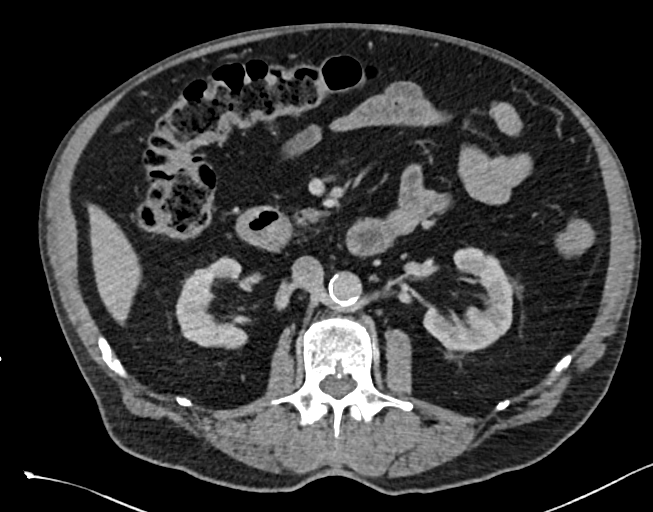
[im 110/148  soft-tissue]
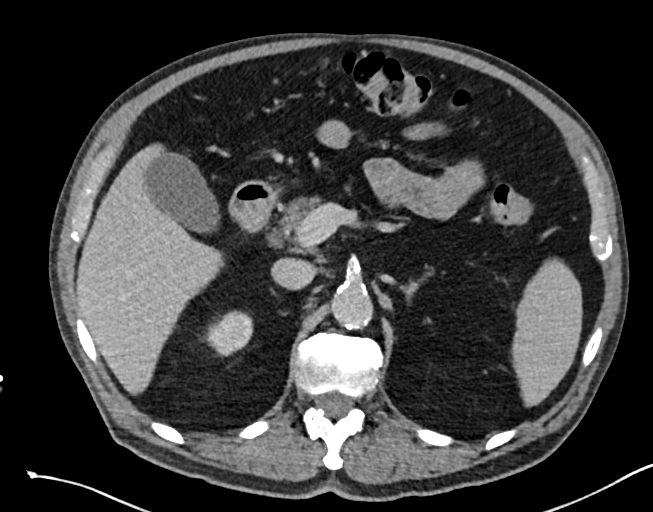
[im 124/148  soft-tissue]
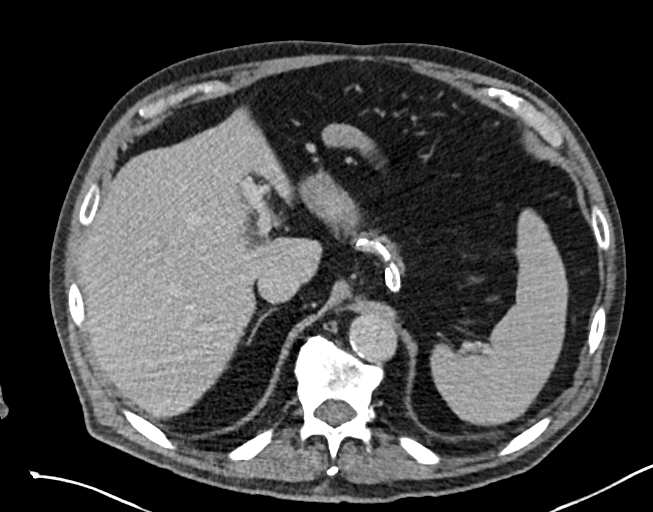
[im 124/148  bone]
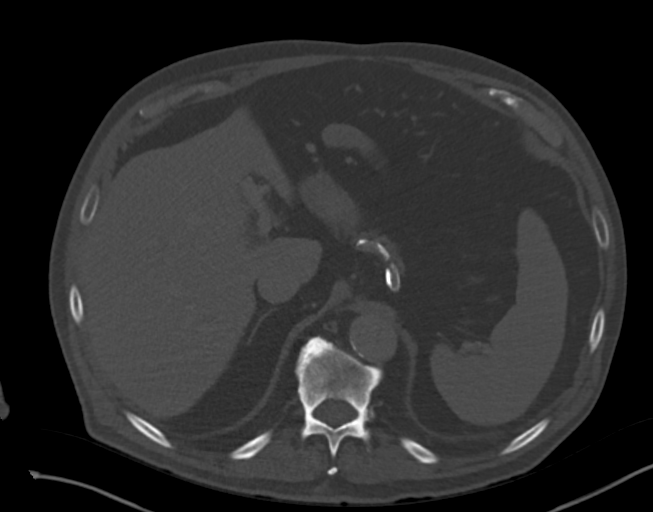
[im 138/148  soft-tissue]
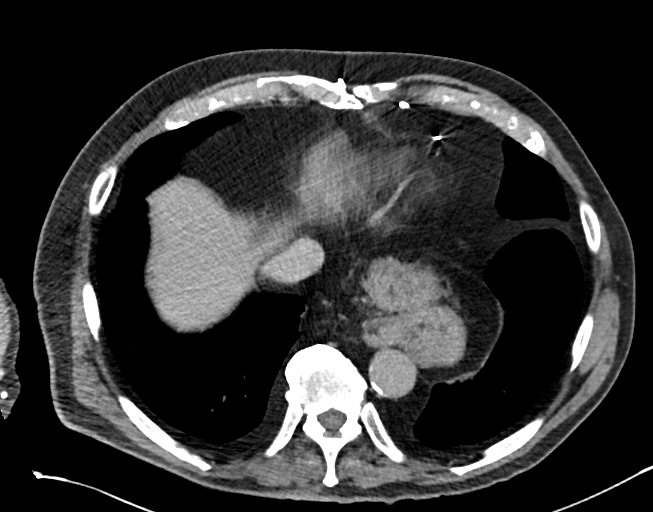

[Series 4: abdomen_pelvis cor 3.00 br40 s3 · coronal · 0.80mm/px · 3 of 105 slices shown]
[im 35/105  soft-tissue]
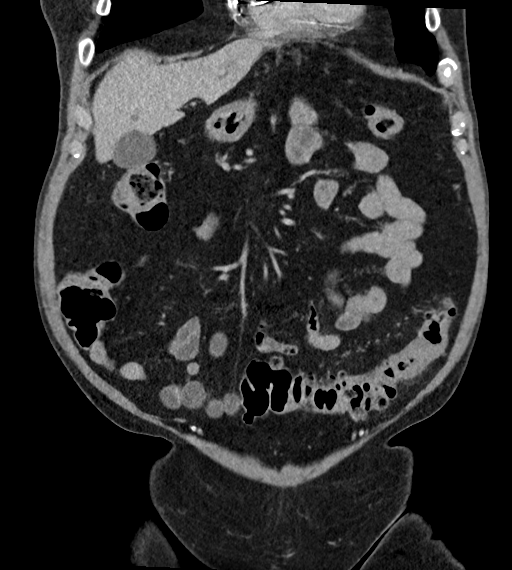
[im 47/105  soft-tissue]
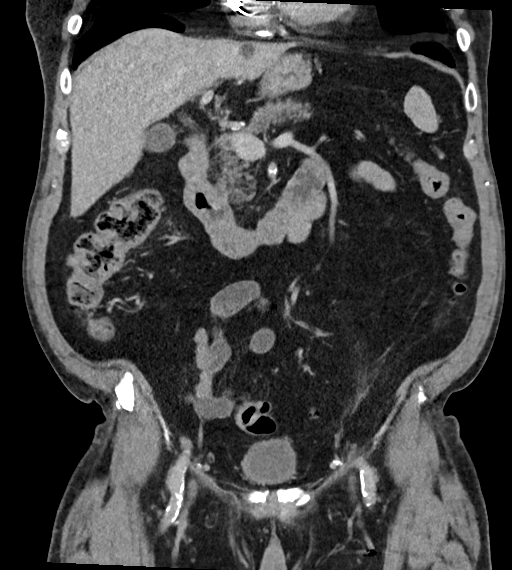
[im 58/105  soft-tissue]
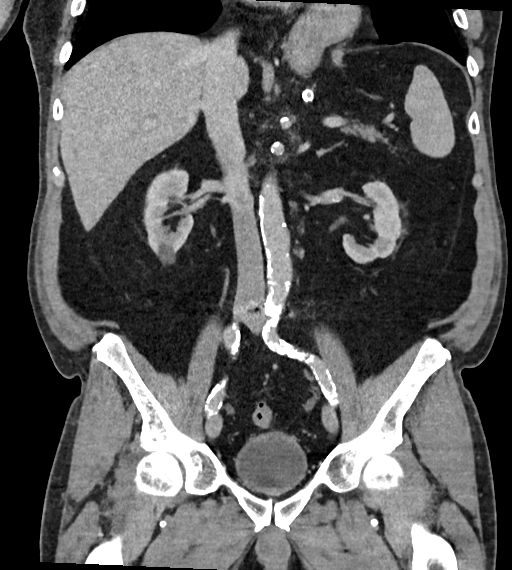

[13 of 46 positions shown; findings below may reference images not displayed]

DIAGNOSTIC STUDIES

EXAM

COMPUTED TOMOGRAPHY, ABDOMEN AND PELVIS; WITH CONTRAST MATERIAL CPT 47344

INDICATION

Left lower abdominal pain. Diarrhea.

TECHNIQUE

Contiguous axial tomographic images were obtained from the lung bases to the symphysis pubis with
both oral and intravenous contrast.

All CT scans at this facility use dose modulation, iterative reconstruction, and/or weight based
dosing when appropriate to reduce radiation dose to as low as reasonably achievable.

O CT and nuclear scans in the last year.

COMPARISONS

No priors available for comparison.

FINDINGS

The heart appears normal in size. No pericardial effusion. The lung bases are clear. The liver is
normal in size without intrahepatic biliary ductal dilatation. There are several small hepatic
cysts. The spleen, pancreas, gallbladder and adrenal glands appear grossly unremarkable. No
hydronephrosis. No bowel obstruction or free air. Severe sigmoid diverticulosis. There is focal
inflammatory stranding and thickening involving a diverticulum. Moderate size hiatal hernia. No
significant small bowel dilatation. Severe atherosclerotic disease. Subcentimeter retroperitoneal
nodes. Mild thickening of the urinary bladder. Moderately enlarged prostate. Small left inguinal
hernia containing fat. Discectomy at the level of L4-5.

IMPRESSION

1. Acute sigmoid diverticulitis. No abscess.

Tech Notes:

PT C/O LEFT LOWER ABD PAIN, LOOSE STOOLS. GFR 49. 50ML [id number]. CT/NM 0/0. TJ

## 2023-10-15 ENCOUNTER — Encounter: Admit: 2023-10-15 | Discharge: 2023-10-15 | Payer: MEDICARE

## 2023-10-19 ENCOUNTER — Encounter: Admit: 2023-10-19 | Discharge: 2023-10-19 | Payer: MEDICARE

## 2023-10-19 DIAGNOSIS — Z950 Presence of cardiac pacemaker: Secondary | ICD-10-CM

## 2023-10-25 ENCOUNTER — Encounter: Admit: 2023-10-25 | Discharge: 2023-10-25 | Payer: MEDICARE

## 2023-10-26 ENCOUNTER — Ambulatory Visit: Admit: 2023-10-26 | Discharge: 2023-10-27 | Payer: MEDICARE

## 2023-10-26 ENCOUNTER — Encounter: Admit: 2023-10-26 | Discharge: 2023-10-26 | Payer: MEDICARE

## 2023-11-09 ENCOUNTER — Encounter: Admit: 2023-11-09 | Discharge: 2023-11-09 | Payer: MEDICARE

## 2023-11-09 MED ORDER — ATORVASTATIN 80 MG PO TAB
80 mg | ORAL_TABLET | Freq: Every day | ORAL | 3 refills | Status: AC
Start: 2023-11-09 — End: ?

## 2023-12-16 ENCOUNTER — Encounter: Admit: 2023-12-16 | Discharge: 2023-12-16 | Payer: MEDICARE

## 2023-12-16 DIAGNOSIS — I10 Essential (primary) hypertension: Secondary | ICD-10-CM

## 2023-12-16 DIAGNOSIS — I480 Paroxysmal atrial fibrillation: Secondary | ICD-10-CM

## 2023-12-16 DIAGNOSIS — E785 Hyperlipidemia, unspecified: Secondary | ICD-10-CM

## 2023-12-16 MED ORDER — SOTALOL 120 MG PO TAB
120 mg | ORAL_TABLET | Freq: Two times a day (BID) | ORAL | 1 refills | 30.00000 days | Status: AC
Start: 2023-12-16 — End: ?

## 2024-01-10 ENCOUNTER — Ambulatory Visit: Admit: 2024-01-10 | Discharge: 2024-01-11 | Payer: MEDICARE

## 2024-01-11 ENCOUNTER — Encounter: Admit: 2024-01-11 | Discharge: 2024-01-11 | Payer: MEDICARE

## 2024-01-17 ENCOUNTER — Encounter: Admit: 2024-01-17 | Discharge: 2024-01-17 | Payer: MEDICARE

## 2024-01-18 ENCOUNTER — Ambulatory Visit: Admit: 2024-01-18 | Discharge: 2024-01-19 | Payer: MEDICARE

## 2024-01-18 ENCOUNTER — Encounter: Admit: 2024-01-18 | Discharge: 2024-01-18 | Payer: MEDICARE

## 2024-01-18 DIAGNOSIS — R0989 Other specified symptoms and signs involving the circulatory and respiratory systems: Secondary | ICD-10-CM

## 2024-01-18 DIAGNOSIS — I480 Paroxysmal atrial fibrillation: Secondary | ICD-10-CM

## 2024-01-18 DIAGNOSIS — I10 Essential (primary) hypertension: Secondary | ICD-10-CM

## 2024-01-18 DIAGNOSIS — Z950 Presence of cardiac pacemaker: Secondary | ICD-10-CM

## 2024-01-18 DIAGNOSIS — E785 Hyperlipidemia, unspecified: Secondary | ICD-10-CM

## 2024-01-18 DIAGNOSIS — I2510 Atherosclerotic heart disease of native coronary artery without angina pectoris: Secondary | ICD-10-CM

## 2024-01-18 MED ORDER — CARVEDILOL 12.5 MG PO TAB
12.5 mg | ORAL_TABLET | Freq: Two times a day (BID) | ORAL | 3 refills | 90.00000 days | Status: AC
Start: 2024-01-18 — End: ?

## 2024-01-18 NOTE — Assessment & Plan Note
AF dates back to 2003.  Sotalol, no A/C.  AF burden assessed by PPM, roughly 1% over the past year.

## 2024-01-18 NOTE — Patient Instructions
Take coreg 12.5mg  twice a day take with a meal  Renal US duplex  Follow up as directed.  Call sooner if issues.  Call the [facility] nursing line at [phone].  Leave a detailed message for the nurse in [place] with how we can assist you and we will call you back.

## 2024-01-18 NOTE — Assessment & Plan Note
Last assessment was reg/thal in June, 2023, non-ischemic.  Last echo 05/2022, EF 65%.  Valves OK.

## 2024-01-18 NOTE — Progress Notes
Date of Service: 01/18/2024    Joshua Keller is a 83 y.o. male.       HPI     Joshua [facility] was in the [place] clinic today for follow-up regarding his pacemaker, coronary disease, and risk factors.  He's playing golf 3 days/week.  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     He has noticed a decline in exercise tolerance with breathlessness over the past few months.  His BP in the clinic today was quite elevated and this is new.  He hasn't really been checking BP at home, but when he was in for carpal tunnel surgery a couple of months ago his BP was apparently OK.       Vitals:    01/18/24 1356   BP: (!) 184/88   BP Source: Arm, Left Upper   Pulse: 84   SpO2: 98%   O2 Device: None (Room air)   PainSc: Zero   Weight: 87.5 kg (193 lb)   Height: 170.2 cm (5' 7)     Body mass index is 30.23 kg/m?[name]     Past Medical History  Patient Active Problem List    Diagnosis Date Noted    Screening for cardiovascular condition 05/27/2017     11/2016 - Abdominal Duplex:  1.8 cm mid-abdominal aorta.  No aneurysm.      Left-sided weakness 04/05/2014    S/P drug eluting coronary stent placement 01/03/2014    PUD (peptic ulcer disease) 01/01/2014    Convulsion (HCC) 06/20/2013     06/05/2013 - 90-second episode of tonic-clonic motor activity and unresponsiveness.  Non-contrast CT-head in [facility] ED negative.  03/2014 - Episode of left leg weakness.  Hospitalized overnight on Neurology service at [facility].  EEG, CT-head negative.  Attributed to focal seizure.  No new meds.      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  Moderate right external carotid stenosis.  Normal vertebral arteries.    11/2016 - Carotid Duplex:  50-79% (133) RICA, 0-49% (102) LICA.        CAD (coronary artery disease) 09/10/2009     3/94 - Inferior MI.        3/94 - CABG x 4, [facility], Dr. Lennart [facility]: LIMA-LAD, SSVG-2 sites of OMB, SVG-RCA.        10/98 - EXEC: EF 55%, mild anteroseptal hypokinesis improves with stress, non-ischemic.        4/99 - EXEC: EF 55%, non-ischemic study.        3/01 - EXEC: EF 55-60%, non-ischemic study.        5/02 - Cardiac cath, [facility]: LIMA-LAD patent, SVG-RCA patent, sequential graft-OM1 and                  OM2 patent, 50% plaque in graft to RCA (not obstructed), native RCA, LAD and LCX                   totally occluded, EF 50-55%        1/04 - Stress thallium (in-pt): EF 66%, low probability for exercise-induced ischemia.        1/06 - Cardiac cath: definite progression of CAD in SVG to RCA with focal 70-80%                   stenosis in mid portion of graft. Native RCA totally occluded. SVG  to two Cx                   marginal branches widely patent, as was LIMA to LAD. LVEF 55%.  PCI/stent                   to SVG to RCA (bare metal stent).   12/2013 -    DES to native, distal RCA beyond the insertion of vein graft [name]).  Other finding:  SVG patent to the OM 1 and 2 with a new eccentric 60% to 70%                       stenosis in the distal graft just before the 1st anastomosis. Our recommendation is to manage this conservatively.  07/13/20 Echo: Left Ventricle: EF 55%. The left ventricular size is normal. Concentric remodeling. There are segmental wall motion abnormalities, as coded in the diagram below (new compared to 03/30/2013 study). Abnormal septal motion consistent with post-operative state. Left ventricular diastolic dysfunction. Right Ventricle: The right ventricular size is normal. The right ventricular systolic function is mildly reduced. No hemodynamically significant valvular abnormalities.  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 80% to 90% stenosis in the mid saphenous vein graft in sequence to obtuse marginal 1 and obtuse marginal 2, which was thought to be the culprit lesion, with placement of a 4.0 x 28 mm Xience Skypoint stent that was post dilated with a 4.5 NC balloon. Patent LIMA to LAD graft, patent saphenous vein graft to right coronary artery graft with a patent stent in its midportion. Patent saphenous vein graft-obtuse marginal graft, status post percutaneous coronary intervention to the 80% to 90% stenosis in its mid segment. Low left ventricular end-diastolic pressure.        Hypertension 09/10/2009    Hyperlipidemia 09/10/2009     3/01 - Total 165, trig 139, HDL 39, LDL 98; Baycol 0.4mg .         1/02 - Total 228, trig 144, HDL 43, LDL 155; no meds.      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        1/04 - Transfer [place] d/t recurrence of AF and chest pressure. Cardizem d/c'd d/t                  bradycardia after conversion to NSR.         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 Toprol XL 100mg  initiated.      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   HENT: Negative.     Eyes: Negative.    Cardiovascular:  Positive for dyspnea on exertion and palpitations.   Respiratory: Negative.     Endocrine: Negative.    Hematologic/Lymphatic: Negative.    Skin: Negative.    Musculoskeletal: Negative.    Gastrointestinal: Negative.    Genitourinary: Negative.    Neurological:  Positive for dizziness.   Psychiatric/Behavioral: Negative.     Allergic/Immunologic: Negative.        Physical Exam  Physical Exam   General Appearance: no distress   Skin: warm, no ulcers or xanthomas   Digits and Nails: no cyanosis or clubbing   Eyes: conjunctivae and lids normal, pupils are equal and round   Teeth/Gums/Palate: dentition unremarkable, no lesions   Lips & Oral Mucosa: no pallor or cyanosis   Neck Veins: normal JVP , neck veins are not distended   Thyroid: no nodules, masses, tenderness or enlargement   Chest Inspection: chest is normal in appearance   Respiratory Effort: breathing comfortably, no respiratory distress   Auscultation/Percussion: lungs clear to auscultation, no rales or rhonchi, no wheezing   PMI: PMI not enlarged or displaced   Cardiac Rhythm: regular rhythm and normal rate   Cardiac Auscultation: S1, S2 normal, no rub, no gallop   Murmurs: no murmur   Peripheral Circulation: normal peripheral circulation   Carotid Arteries: normal carotid upstroke bilaterally, no bruits   Radial Arteries: normal symmetric radial pulses   Abdominal Aorta: no abdominal aortic bruit   Pedal Pulses: normal symmetric pedal pulses   Lower Extremity Edema: no lower extremity edema   Abdominal Exam: soft, non-tender, no masses, bowel sounds normal   Liver & Spleen: no organomegaly   Gait & Station: walks without assistance   Muscle Strength: normal muscle tone   Orientation: oriented to time, place and person   Affect & Mood: appropriate and sustained affect   Language and Memory: patient responsive and seems to comprehend information   Neurologic Exam: neurological assessment grossly intact   Other: moves all extremities      Cardiovascular Studies    EKG:  Atrial-paced rhythm with prolonged AV conduction, rate 82.  RBBB.    Cardiovascular Health Factors  Vitals BP Readings from Last 3 Encounters:   01/18/24 (!) 184/88   07/01/23 137/78   12/08/22 118/60     Wt Readings from Last 3 Encounters:   01/18/24 87.5 kg (193 lb)   07/01/23 85.9 kg (189 lb 6.4 oz)   12/08/22 85.3 kg (188 lb)     BMI Readings from Last 3 Encounters:   01/18/24 30.23 kg/m?   07/01/23 29.66 kg/m?   12/08/22 29.44 kg/m?      Smoking Social History     Tobacco Use   Smoking Status Never   Smokeless Tobacco Never      Lipid Profile Cholesterol   Date Value Ref Range Status   01/15/2024 133  Final     HDL   Date Value Ref Range Status   01/15/2024 33 (L) >40 Final     LDL Date Value Ref Range Status   01/15/2024 61  Final     Triglycerides   Date Value Ref Range Status   01/15/2024 196 (H) <150 Final      Blood Sugar Hemoglobin A1C   Date Value Ref Range Status   07/13/2020 5.8 4.0 - 6.0 % Final     Comment:     The ADA recommends that most patients with type 1 and type 2 diabetes maintain   an A1c level <7%.       Glucose   Date Value Ref Range Status   01/15/2024 100 70 - 105 Final   12/14/2022 109 (H) 70 - 105 Final   05/19/2022 99  Final     Glucose, POC   Date Value Ref Range Status   04/05/2014 101 (H) 70 - 100 MG/DL Final          Problems Addressed Today  Encounter  Diagnoses   Name Primary?    Paroxysmal atrial fibrillation (HCC) Yes    Coronary artery disease involving native coronary artery of native heart without angina pectoris     Hyperlipidemia, unspecified hyperlipidemia type     Primary hypertension     Pacemaker     Cardiovascular symptoms        Assessment and Plan       Atrial fibrillation (HCC)  AF dates back to 2003.  Sotalol, no A/C.  AF burden assessed by PPM, roughly 1% over the past year.    CAD (coronary artery disease)  Last assessment was reg/thal in June, 2023, non-ischemic.  Last echo 05/2022, EF 65%.  Valves OK.    Hyperlipidemia  Lab Results   Component Value Date    CHOL 133 01/15/2024    TRIG 196 (H) 01/15/2024    HDL 33 (L) 01/15/2024    LDL 61 01/15/2024    VLDL 39 01/15/2024    NONHDLCHOL 106 07/12/2020    CHOLHDLC 4 01/15/2024      Atorva 80/d    Hypertension  No BP-lowering medications.  Long history of occasional vagal episodes--we've had to be careful not to lower BP excessively.    I don't know why his BP has spiked.  I ordered a renal artery Duplex looking for significant stenosis.  I'm starting carvedilol 12.5 BID and we will check with him by telephone through the remainder of the week to up-titrate the carvedilol as necessary to get his BP down to 140/80 or less.    Pacemaker  Generator replacement in 2022.  He paces the RA essentially 100%.  Expected battery longevity about 8 years.    Current Medications (including today's revisions)   acetaminophen/diphenhydramine (TYLENOL PM PO) Take 325 mg by mouth at bedtime daily.    aspirin EC 81 mg tablet Take 1 Tab by mouth daily.    atorvastatin (LIPITOR) 80 mg tablet Take one tablet by mouth daily.    carvediloL (COREG) 12.5 mg tablet Take one tablet by mouth twice daily with meals. Take with food.    loratadine (CLARITIN) 10 mg PO tablet Take one tablet by mouth daily.    nitroglycerin (NITROSTAT) 0.4 mg tablet Place one tablet under tongue every 5 minutes as needed for Chest Pain.    prednisone (DELTASONE) 1 mg tablet Take three tablets to four tablets by mouth daily with breakfast.    sotaloL (BETAPACE) 120 mg tablet TAKE 1 TABLET BY MOUTH TWICE A DAY    tamsulosin (FLOMAX) 0.4 mg capsule Take one capsule by mouth daily. Do not crush, chew or open capsules. Take 30 minutes following the same meal each day.     Total time spent on today's office visit was 45 minutes.  This includes face-to-face in person visit with patient as well as nonface-to-face time including review of the EMR, outside records, labs, radiologic studies, echocardiogram & other cardiovascular studies, formation of treatment plan, after visit summary, future disposition, and lastly on documentation.

## 2024-01-18 NOTE — Assessment & Plan Note
Generator replacement in 2022.  He paces the RA essentially 100%.  Expected battery longevity about 8 years.

## 2024-01-18 NOTE — Assessment & Plan Note
Lab Results   Component Value Date    CHOL 133 01/15/2024    TRIG 196 (H) 01/15/2024    HDL 33 (L) 01/15/2024    LDL 61 01/15/2024    VLDL 39 01/15/2024    NONHDLCHOL 106 07/12/2020    CHOLHDLC 4 01/15/2024      Atorva 80/d

## 2024-01-20 ENCOUNTER — Encounter: Admit: 2024-01-20 | Discharge: 2024-01-21 | Payer: MEDICARE

## 2024-01-24 ENCOUNTER — Encounter: Admit: 2024-01-24 | Discharge: 2024-01-24 | Payer: MEDICARE

## 2024-01-24 NOTE — Telephone Encounter
 [facility] called into the nursing line.  He states that after his recent appointment with Dr. Barry Dienes, his BP went really high and was 200/102.  He called his PCP and he prescribed Losartan 50mg  daily to take along with the Coreg that was prescribed by Dr. Barry Dienes.  [facility] states that his BP this morning 182/103 before taking his medication.  After medication 135/72, and then 116/66.      Called and discussed with [facility].  He states that in the evenings his blood pressure will also go up and has been 185/78 and the highest was 202/102.  Pt states that he has a headache and some dizziness when his pressure is high.  He has a pacemaker, but states his heart rate is usually around 80.    [facility] is taking his medications as prescribed.  He is taking the losartan 50mg  in the morning.    Will route to Dr. Barry Dienes for review and recommendations.

## 2024-01-27 ENCOUNTER — Encounter: Admit: 2024-01-27 | Discharge: 2024-01-27 | Payer: MEDICARE

## 2024-01-28 ENCOUNTER — Encounter: Admit: 2024-01-28 | Discharge: 2024-01-28 | Payer: MEDICARE

## 2024-01-28 ENCOUNTER — Ambulatory Visit: Admit: 2024-01-28 | Discharge: 2024-01-29 | Payer: MEDICARE

## 2024-02-01 ENCOUNTER — Encounter: Admit: 2024-02-01 | Discharge: 2024-02-01 | Payer: MEDICARE

## 2024-02-01 NOTE — Telephone Encounter
 [facility] called into the nursling line to report that his blood pressure remains high even after adjusting the times he is taking his medication.  He states that his blood pressure is mostly high all the time, but he feels like after 5, it just keeps creeping higher and higher.  His BP readings that he reports are as follows: 156/82, 148/89, 165/88, 156/81 160/82, 149/86, 179/97, 157/111.  His heart rate has been in the 70-80s.      [facility] is taking his medications as prescribed.  He is taking the Coreg at 7 and 7, losartan at noon.  He states that he just feels off and can tell his blood pressure is high.  He denies any headache, vision changes, chest pain, but does feel lightheaded occasionally.    Josh[facility] Keller also had a renal artery ultrasound on 2/21 and would like Dr. Barry Dienes recommendations regarding his testing.    Will route to Dr. Barry Dienes for review and recommendations.

## 2024-02-10 ENCOUNTER — Encounter: Admit: 2024-02-10 | Discharge: 2024-02-10 | Payer: MEDICARE

## 2024-02-10 DIAGNOSIS — E785 Hyperlipidemia, unspecified: Secondary | ICD-10-CM

## 2024-02-10 DIAGNOSIS — I2510 Atherosclerotic heart disease of native coronary artery without angina pectoris: Secondary | ICD-10-CM

## 2024-02-10 DIAGNOSIS — Z951 Presence of aortocoronary bypass graft: Secondary | ICD-10-CM

## 2024-02-10 DIAGNOSIS — I480 Paroxysmal atrial fibrillation: Secondary | ICD-10-CM

## 2024-02-10 DIAGNOSIS — I10 Essential (primary) hypertension: Secondary | ICD-10-CM

## 2024-02-10 MED ORDER — FUROSEMIDE 20 MG PO TAB
20 mg | ORAL_TABLET | Freq: Every day | ORAL | 0 refills | 90.00000 days | Status: AC | PRN
Start: 2024-02-10 — End: ?

## 2024-02-10 NOTE — Telephone Encounter
 Patient sent below message. The patient has had 10 lb weight gain in a week. He has swelling in his feet. He hasn't changed diet and no increase in sodium. No other symptoms. He stated his BP was OK.    Basic Metabolic Profile    Lab Results   Component Value Date/Time    NA 142 01/15/2024 12:00 AM    K 4.6 01/15/2024 12:00 AM    CA 8.9 01/15/2024 12:00 AM    CL 111 (H) 01/15/2024 12:00 AM    CO2 24 01/15/2024 12:00 AM    GAP 8 12/14/2022 12:00 AM    Lab Results   Component Value Date/Time    BUN 16.1 01/15/2024 12:00 AM    CR 1.33 (H) 01/15/2024 12:00 AM    GLU 100 01/15/2024 12:00 AM            Routed to Dr. Barry Dienes.      FW: Weight gain  Weight gain  02/10/24  1:38 PM  OK thank you for the information. I am going to send this to Dr. Barry Dienes.   Efraim Kaufmann, RN      Ricke Hey Calabretta to P Cvm Nurse Triage [place]  02/10/24 12:31 PM  Don?t eat out very much. No change in diet. Bp not bad.  8 am 3/05  167/86 after carvedilol 25 mg  down  to 145 /85. I take 50 mg of losartan at noon and 25 mg of carvdilol @ 6 pm.   Today 6 am 145/79 noon  126/66.       Me to Ricke Hey Keshishyan and proxy Khair Chasteen Wormley)   02/10/24 11:26 AM  Dear Casimiro Needle,   Thank you for your message. I have a few questions:   1) are you noticing any shortness of breath?  2) have you been eating out more at restaurants? More canned foods or foods high in sodium/salt?  3) if you check your blood pressure, how has it been doing in the last week?      Ricke Hey Dolley to P Cvm Nurse Triage [place] 02/10/24  8:31 AM  I have gained 10 lbs in a week. I noticed my feet are swollen. I  have  always been able to see my ankle bones. Now I can?t  see my ankle bones. I think I am retaining fluid. Let me know what you think.

## 2024-02-10 NOTE — Telephone Encounter
 From: Vanice Sarah, MD   Sent: 02/10/2024   3:42 PM CST   To: Connye Burkitt, RN     Weird.  He has a history of clinically silent AF so we should have him get an EKG tomorrow.  Our office isn't open, but he could get it done with his primary there at [facility] think it's Dr. Herschell Dimes.  Also, let's have him try taking furosemide 20 mg/day through the weekend.  Thanks!  If you want to forward to the [facility] nurses they are usually the ones who field his calls.  Thanks!           Recommendations called to patient. Patient verbalized understanding. EKG order faxed to [facility].

## 2024-02-14 ENCOUNTER — Encounter: Admit: 2024-02-14 | Discharge: 2024-02-14 | Payer: MEDICARE

## 2024-02-14 NOTE — Telephone Encounter
 [facility] called in then nursing line to report his BP readings after increasing his coreg dosing.    BP readings are:  3/7 - 144/81, 118/65, 127/70. 13276  3/8 - 150/85 137/67 147/80,   3/9 -  144/73 127/72 130/75,   3/10 - 117/65      [facility] states that hs is feeling some overall fatigue and some occasional lightheadedness, but is happy his blood pressure are better controlled.      Will route to Dr. Barry Dienes for any additional recommendations.

## 2024-03-03 ENCOUNTER — Encounter: Admit: 2024-03-03 | Discharge: 2024-03-03

## 2024-03-03 MED ORDER — FUROSEMIDE 20 MG PO TAB
20 mg | ORAL_TABLET | Freq: Every day | ORAL | 3 refills | 90.00000 days | Status: AC | PRN
Start: 2024-03-03 — End: ?

## 2024-03-10 ENCOUNTER — Encounter: Admit: 2024-03-10 | Discharge: 2024-03-10

## 2024-03-10 NOTE — Telephone Encounter
 Called patient due to abnormal device check yellow alert.  He states he has been feeling lightheaded, dizzy and weak.  He reports low bps but list readings of 136/67, 151/72, 134/72 and 117/57.  Ask pt to keep a bp log and come in for bp check Tuesday.  He has reduced coreg to 12.5mg  bid.

## 2024-03-10 NOTE — Telephone Encounter
-----   Message from [place] sent at 03/10/2024  9:14 AM CDT -----  Regarding: FW: SDO: yellow alert for high VP    ----- Message -----  From: Fransico Loel, RN  Sent: 03/10/2024   8:50 AM CDT  To: Connye Burkitt, RN  Subject: FW: SDO: yellow alert for high VP                  ----- Message -----  From: Lavell Luster, RN  Sent: 03/10/2024   8:50 AM CDT  To: Cvm Nurse Gen Card Team Red  Subject: SDO: yellow alert for high VP                    Hello,    We received a yellow alert on this patient for high VP.     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 ~20% in early 2025, and a slow increase to ~35% since mid March 2025.  Per chart review, underlying rhythm SB 30-40s bpm  AV Search is programmed on  His PR interval in presenting rhythm is about 320 ms.     I'm not sure there is anything else we can do to his device to prevent VP.   Please review and follow up as necessary. Thanks!    -Rebecca/device

## 2024-03-14 ENCOUNTER — Encounter: Admit: 2024-03-14 | Discharge: 2024-03-14

## 2024-03-14 DIAGNOSIS — I480 Paroxysmal atrial fibrillation: Secondary | ICD-10-CM

## 2024-03-14 DIAGNOSIS — I10 Essential (primary) hypertension: Secondary | ICD-10-CM

## 2024-03-14 DIAGNOSIS — Z951 Presence of aortocoronary bypass graft: Secondary | ICD-10-CM

## 2024-03-14 DIAGNOSIS — I2510 Atherosclerotic heart disease of native coronary artery without angina pectoris: Secondary | ICD-10-CM

## 2024-03-14 DIAGNOSIS — E785 Hyperlipidemia, unspecified: Secondary | ICD-10-CM

## 2024-03-14 LAB — NT-PRO-BNP: BNP: 143 — ABNORMAL HIGH (ref 0–100)

## 2024-03-14 LAB — BASIC METABOLIC PANEL
ANION GAP: 8
BLD UREA NITROGEN: 27 — ABNORMAL HIGH (ref 8.4–25.7)
CALCIUM: 9.3
CHLORIDE: 110 — ABNORMAL HIGH (ref 98–107)
CO2: 24
CREATININE: 1.6 — ABNORMAL HIGH (ref 0.72–1.25)
GFR ESTIMATED: 40 — ABNORMAL LOW (ref >59)
GLUCOSE,PANEL: 78
POTASSIUM: 4.3
SODIUM: 142

## 2024-03-14 MED ORDER — NITROGLYCERIN 0.4 MG SL SUBL
.4 mg | ORAL_TABLET | SUBLINGUAL | 3 refills | 9.00000 days | Status: AC | PRN
Start: 2024-03-14 — End: ?

## 2024-03-14 MED ORDER — CARVEDILOL 12.5 MG PO TAB
12.5 mg | Freq: Two times a day (BID) | ORAL | 0 refills | 90.00000 days | Status: AC
Start: 2024-03-14 — End: ?

## 2024-03-14 MED ORDER — LOSARTAN 25 MG PO TAB
25 mg | Freq: Every day | ORAL | 0 refills | 90.00000 days | Status: AC
Start: 2024-03-14 — End: ?

## 2024-03-14 NOTE — Progress Notes
 Patient is here for bp check and medication review.  He states he feels weak.  He has stopped losartan and decreased coreg to 12.5mg  bid.  He c/o edema in his hands and dependently and is taking lasix 20mg  daily.  BP range from 150's to 130's/80-70 pulse is usually 80.  Discussed with Dr. Lander Pines.  Labs ordered.  Patient is to resume losartan at 25mg  daily.

## 2024-03-15 ENCOUNTER — Encounter: Admit: 2024-03-15 | Discharge: 2024-03-15

## 2024-04-26 ENCOUNTER — Encounter: Admit: 2024-04-26 | Discharge: 2024-04-26 | Payer: MEDICARE

## 2024-04-27 ENCOUNTER — Encounter: Admit: 2024-04-27 | Discharge: 2024-04-27 | Payer: MEDICARE

## 2024-04-27 DIAGNOSIS — I10 Essential (primary) hypertension: Secondary | ICD-10-CM

## 2024-04-27 DIAGNOSIS — I6523 Occlusion and stenosis of bilateral carotid arteries: Secondary | ICD-10-CM

## 2024-04-27 DIAGNOSIS — Z136 Encounter for screening for cardiovascular disorders: Secondary | ICD-10-CM

## 2024-04-27 DIAGNOSIS — I2510 Atherosclerotic heart disease of native coronary artery without angina pectoris: Secondary | ICD-10-CM

## 2024-04-27 DIAGNOSIS — I480 Paroxysmal atrial fibrillation: Secondary | ICD-10-CM

## 2024-04-27 DIAGNOSIS — Z951 Presence of aortocoronary bypass graft: Secondary | ICD-10-CM

## 2024-04-27 NOTE — Telephone Encounter
 Called and discussed with patient.  He is agreeable to plan.  Scheduled patient with Dr .Reida Car.  Pt confirmed appointment time and location.

## 2024-04-27 NOTE — Telephone Encounter
-----   Message from Baxter Limber, MD sent at 04/27/2024  6:55 AM CDT -----  Regarding: RE: [facility] pt increasing RV pacing %  Is Tarun still seeing patients in [place] or [place]?  If so, can we have him see Athena Bland for work-up of possible infiltration cardiomyopathy like amyloid?  I still don?t understand what?s going on with him.  Thanks.  ----- Message -----  From: Kincaid, Kaylee, RN  Sent: 04/26/2024   9:55 AM CDT  To: Zannie Hey, MD; C[facility] Nurse Gen Card Team #  Subject: [facility] pt increasing RV pacing %                    Remote from 5/15 shows AP-VP at 80bpm w/no intrinsic conduction trends show RV pacing has been increasing over time with recent alert on 4/4 phone notes from RN/pt suggest he is likely symptomatic with thisMaguire, StephenEncounter Date: 4/4/2025Called patient due to abnormal device check yellow alert.  He states he has been feeling lightheaded, dizzy and weak.  He reports low bps but list readings of 136/67, 151/72, 134/72 and 117/57.  Ask pt to keep a bp log and come in for bp check Tuesday.  He has reduced coreg to 12.5mg  bid.03/14/24 1501Patient is here for bp check and medication review.  He states he feels weak.  He has stopped losartan and decreased coreg to 12.5mg  bid.  He c/o edema in his hands and dependently and is taking lasix 20mg  daily.  BP range from 150's to 130's/80-70 pulse is usually 80.  Discussed with Dr. Lander Pines.  Labs ordered.  Patient is to resume losartan at 25mg  daily. He has a known underlying AV block and pacer low rate is programmed at 80bpmI'm thinking we can likely improve intrinsic conduction, AV synchrony and minimize some V pacing with a couple minor changes to his settings: low rate to 60 or 70bpm and extend his search AV to 424ms(max) from .Trends show as much as 50% VP at times recently and anywhere from ~15-25% in the last 1-2weeks.Please review and f/u as needed, thanks!-Kaylee/Device Team

## 2024-04-28 ENCOUNTER — Encounter: Admit: 2024-04-28 | Discharge: 2024-04-28 | Payer: MEDICARE

## 2024-04-28 NOTE — Progress Notes
 [facility] Cardiomyopathy Clinic Navigation Intake Assessment Document    Patient Name:  Joshua Keller  MRN:  [id number]  DOB:  [birth year], 83 y.o.  Primary contact for patient: [phone]   Insurance:   Payor: MEDICARE / Plan: MEDICARE PART A AND B / Product Type: Medicare /    Insurance reviewed: Registration completed by [employer]    Referring Physician: Dr. Lander Pines    Diagnosis & Reason for Visit: Received routine internal referral via referral order to CVM: Advanced Heart Failure - Dr. 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    From: Kincaid, Kaylee, RN  Sent: 04/26/2024   9:55 AM CDT  To: Zannie Hey, MD; Cvm Nurse Gen Card Team #  Subject: Joshua Keller pt increasing RV pacing %                     Remote from 5/15 shows AP-VP at 80bpm w/no intrinsic conduction trends show RV pacing has been increasing over time with recent alert on 4/4 phone notes from RN/pt suggest he is likely symptomatic with thisMaguire, StephenEncounter Date: 4/4/2025Called patient due to abnormal device check yellow alert.  He states he has been feeling lightheaded, dizzy and weak.  He reports low bps but list readings of 136/67, 151/72, 134/72 and 117/57.  Ask pt to keep a bp log and come in for bp check Tuesday.  He has reduced coreg to 12.5mg  bid.03/14/24 1501Patient is here for bp check and medication review.  He states he feels weak.  He has stopped losartan and decreased coreg to 12.5mg  bid.  He c/o edema in his hands and dependently and is taking lasix 20mg  daily.  BP range from 150's to 130's/80-70 pulse is usually 80.  Discussed with Dr. Lander Pines.  Labs ordered.  Patient is to resume losartan at 25mg  daily. He has a known underlying AV block and pacer low rate is programmed at 80bpmI'm thinking we can likely improve intrinsic conduction, AV synchrony and minimize some V pacing with a couple minor changes to his settings: low rate to 60 or 70bpm and extend his search AV to 454ms(max) from .Trends show as much as 50% VP at times recently and anywhere from ~15-25% in the last 1-2weeks.Please review and f/u as needed, thanks!-Kaylee/Device Team    Appointment Plan:     Future Appointments   Date Time Provider Department Center   05/02/2024  3:30 PM Radford Buffy, MD [facility] CVM Exam   08/08/2024 11:00 AM Zannie Hey, MD [facility] CVM Exam       Cardiomyopathy Summary:     Most Recent Cardiology OV note: Office Visit with Zannie Hey, MD (01/18/2024)     Hospitalizations/ER Visits in past 12 months:   none      BMI: Estimated body mass index is 30.07 kg/m? (pended) as calculated from the following:    Height as of 01/28/24: (P) 170.2 cm (5' 7).    Weight as of 03/14/24: 87.1 kg (192 lb).     Summary of Recent Testing/Procedures Pertinent to Diagnosis:   Test/  Procedure Date/  Facility Results Summary   (please reference original report for full results)   Most Recent TTE 05/18/22 at [place] Technically challenging study with poor acoustic windows. The cardiac chambers and valves were not well visualized.  Grossly normal left ventricular size with mildly increased left ventricular wall thickness. Normal left ventricular systolic function with an estimated ejection fraction of 65 to 70%.  Indeterminate left ventricular diastolic function.  Mildly dilated right ventricle with grossly normal right ventricular systolic function.  The cardiac valves are not well-visualized. No significant stenosis or regurgitation by Doppler.  No significant pericardial effusion.  Comparison is made to transthoracic study dated 07/13/2020. No significant changes were found.   Most recent Ischemic Eval: Pharmacologic Stress Test 05/19/22 at [place] 1.  Normal myocardial perfusion scan without evidence of ischemia or infarction.  2.  Normal left ventricular systolic function with normal segmental wall thickening.  There is abnormal septal wall motion consistent with patient's prior history of coronary bypass surgery.  3.  Low risk myocardial perfusion scan.     Compared with study dated 02/10/18, no significant change is noted.   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  80% to 90% stenosis in the mid saphenous vein graft in sequence to obtuse marginal 1 and obtuse marginal 2, which was thought to be the culprit lesion, with placement of a 4.0 x 28 mm Xience Skypoint stent that was post dilated with a 4.5 NC balloon.  Patent left internal mammary artery to left anterior descending artery graft, patent saphenous vein graft to right coronary artery graft with a patent stent in its midportion.  Patent saphenous vein graft-obtuse marginal graft, status post percutaneous coronary intervention to the 80% to 90% stenosis in its mid segment.  Low left ventricular end-diastolic pressure.     Current Therapies Summary:     Cardiac Devices: PPM   Device check completed at [place]: Yes    Cardiac Rehab (for LVEF <40%): yes, completed in 2021    Medical History:    Location of Records:   Care Everywhere: search complete, no outside cardiac records found  Scan Viewer/Outside Records: Yes - outside lab results    Patient Care Team:  Alfrieda Infante, MD as PCP - General (Cardiac Rehabilitation)  Zannie Hey, MD as PCP - Cardiologist  Zannie Hey, MD  Roselinda Congress, RN  Marlyn Sines, RN  Creig Doe, APRN-NP (Gastroenterology)  Vida Gravely, RN  Mychart, Generic Provider    Patient Active Problem List    Diagnosis Date Noted    Screening for cardiovascular condition 05/27/2017     11/2016 - Abdominal Duplex:  1.8 cm mid-abdominal aorta.  No aneurysm.      Left-sided weakness 04/05/2014    S/P drug eluting coronary stent placement 01/03/2014    PUD (peptic ulcer disease) 01/01/2014    Convulsion (CMS-HCC) 06/20/2013     06/05/2013 - 90-second episode of tonic-clonic motor activity and unresponsiveness.  Non-contrast CT[facility]head in [facility] ED negative.  03/2014 - Episode of left leg weakness.  Hospitalized overnight on Neurology service at [facility].  EEG, CT-head negative.  Attributed to focal seizure.  No new meds.      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  Moderate right external carotid stenosis.  Normal vertebral arteries.    11/2016 - Carotid Duplex:  50-79% (133) RICA, 0-49% (102) LICA.        CAD (coronary artery disease) 09/10/2009     3/94 - Inferior MI.        3/94 - CABG x 4, [facility], Dr. Elora Hales: LIMA-LAD, SSVG-2 sites of OMB,                  SVG-RCA.  10/98 - EXEC: EF 55%, mild anteroseptal hypokinesis improves with stress, non-ischemic.        4/99 - EXEC: EF 55%, non-ischemic study.        3/01 - EXEC: EF 55-60%, non-ischemic study.        5/02 - Cardiac cath, [place]: LIMA-LAD patent, SVG-RCA patent, sequential graft-OM1 and                  OM2 patent, 50% plaque in graft to RCA (not obstructed), native RCA, LAD and LCX                   totally occluded, EF 50-55%        1/04 - Stress thallium (in-pt): EF 66%, low probability for exercise-induced ischemia.        1/06 - Cardiac cath: definite progression of CAD in SVG to RCA with focal 70-80%                   stenosis in mid portion of graft. Native RCA totally occluded. SVG to two Cx                   marginal branches widely patent, as was LIMA to LAD. LVEF 55%.  PCI/stent                   to SVG to RCA (bare metal stent).   12/2013 -    DES to native, distal RCA beyond the insertion of vein graft [place]).  Other finding:  SVG patent to the OM 1 and 2 with a new eccentric 60% to 70%                       stenosis in the distal graft just before the 1st anastomosis. Our recommendation is to manage this conservatively.  07/13/20 Echo: Left Ventricle: EF 55%. The left ventricular size is normal. Concentric remodeling. There are segmental wall motion abnormalities, as coded in the diagram below (new compared to 03/30/2013 study). Abnormal septal motion consistent with post-operative state. Left ventricular diastolic dysfunction. Right Ventricle: The right ventricular size is normal. The right ventricular systolic function is mildly reduced. No hemodynamically significant valvular abnormalities.  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 80% to 90% stenosis in the mid saphenous vein graft in sequence to obtuse marginal 1 and obtuse marginal 2, which was thought to be the culprit lesion, with placement of a 4.0 x 28 mm Xience Skypoint stent that was post dilated with a 4.5 NC balloon. Patent LIMA to LAD graft, patent saphenous vein graft to right coronary artery graft with a patent stent in its midportion. Patent saphenous vein graft-obtuse marginal graft, status post percutaneous coronary intervention to the 80% to 90% stenosis in its mid segment. Low left ventricular end-diastolic pressure.        Hypertension 09/10/2009    Hyperlipidemia 09/10/2009     3/01 - Total 165, trig 139, HDL 39, LDL 98; Baycol 0.4mg .         1/02 - Total 228, trig 144, HDL 43, LDL 155; no meds.      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        1/04 - Transfer  [place] d/t recurrence of AF and chest pressure. Cardizem d/c'd d/t                  bradycardia after conversion to NSR.         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 Toprol XL 100mg  initiated.      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       Past Medical History:    Atrial fibrillation (CMS-HCC)    CAD (coronary artery disease)    Hyperlipidemia    Hypertension       Surgical History:   Procedure Laterality Date    REMOVAL AND REPLACEMENT PERMANENT PACEMAKER GENERATOR  - DUAL LEAD SYSTEM Left 03/10/2021    Performed by Lynnetta Sauce, MD at [facility] EP LAB    BACK SURGERY         Family History   Problem Relation Name Age of Onset    Sudden Cardiac Death Father      Heart Attack Father      Heart Failure Brother         Social History     Socioeconomic History    Marital status: Married   Tobacco Use    Smoking status: Never    Smokeless tobacco: Never   Vaping Use    Vaping status: Never Used   Substance and Sexual Activity    Alcohol use: Yes     Alcohol/week: 4.0 standard drinks of alcohol     Types: 4 Shots of liquor per week     Comment: 4/week    Drug use: Never    Sexual activity: Not Currently       Allergies   Allergen Reactions    Sulfamethoxazole SEE COMMENTS     Elevated pancytopenia    Sulfate Salt ANAPHYLAXIS    Trimethoprim SEE COMMENTS     Elevated pancytopenia    Chlorpromazine UNKNOWN         Current Outpatient Medications:     acetaminophen/diphenhydramine (TYLENOL PM PO), Take 325 mg by mouth at bedtime daily., Disp: , Rfl:     aspirin EC 81 mg tablet, Take 1 Tab by mouth daily., Disp: 90 Tab, Rfl:     atorvastatin (LIPITOR) 80 mg tablet, Take one tablet by mouth daily., Disp: 90 tablet, Rfl: 3    carvediloL (COREG) 12.5 mg tablet, Take one tablet by mouth twice daily with meals. Take with food., Disp: , Rfl:     furosemide (LASIX) 20 mg tablet, TAKE ONE TABLET BY MOUTH DAILY AS NEEDED. FOR WEIGHT GAIN/SWELLING., Disp: 90 tablet, Rfl: 3    loratadine (CLARITIN) 10 mg PO tablet, Take one tablet by mouth daily., Disp: , Rfl:     losartan (COZAAR) 25 mg tablet, Take one tablet by mouth daily., Disp: , Rfl:     nitroglycerin (NITROSTAT) 0.4 mg tablet, Place one tablet under tongue every 5 minutes as needed for Chest Pain., Disp: 25 tablet, Rfl: 3    prednisone (DELTASONE) 1 mg tablet, Take three tablets to four tablets by mouth daily with breakfast., Disp: , Rfl:     sotaloL (BETAPACE) 120 mg tablet, TAKE 1 TABLET BY MOUTH TWICE A DAY, Disp: 180 tablet, Rfl: 1    tamsulosin (FLOMAX) 0.4 mg capsule, Take one capsule by mouth daily. Do not crush, chew or open capsules. Take 30 minutes following the same  meal each day., Disp: , Rfl:     Rexford Catchings, RN, BSN, [facility]  Nurse Navigator - Cardiomyopathy Clinic  Phone: [phone]  Fax: [phone]  [facility]  Outpatient Cardiology Dept  Doctors: Kandee Orion, [place], [place]. Benjaman Branch. Mason Sole

## 2024-04-30 ENCOUNTER — Encounter: Admit: 2024-04-30 | Discharge: 2024-04-30 | Payer: MEDICARE

## 2024-05-01 ENCOUNTER — Encounter: Admit: 2024-05-01 | Discharge: 2024-05-01 | Payer: MEDICARE

## 2024-05-02 ENCOUNTER — Encounter: Admit: 2024-05-02 | Discharge: 2024-05-02 | Payer: MEDICARE

## 2024-05-02 DIAGNOSIS — G5601 Carpal tunnel syndrome, right upper limb: Secondary | ICD-10-CM

## 2024-05-02 DIAGNOSIS — E8770 Fluid overload, unspecified: Secondary | ICD-10-CM

## 2024-05-02 DIAGNOSIS — I4510 Unspecified right bundle-branch block: Secondary | ICD-10-CM

## 2024-05-02 DIAGNOSIS — I480 Paroxysmal atrial fibrillation: Secondary | ICD-10-CM

## 2024-05-02 DIAGNOSIS — E785 Hyperlipidemia, unspecified: Secondary | ICD-10-CM

## 2024-05-02 DIAGNOSIS — I10 Essential (primary) hypertension: Secondary | ICD-10-CM

## 2024-05-02 DIAGNOSIS — Z136 Encounter for screening for cardiovascular disorders: Secondary | ICD-10-CM

## 2024-05-02 DIAGNOSIS — Z955 Presence of coronary angioplasty implant and graft: Secondary | ICD-10-CM

## 2024-05-02 DIAGNOSIS — Z951 Presence of aortocoronary bypass graft: Secondary | ICD-10-CM

## 2024-05-02 MED ORDER — FUROSEMIDE 40 MG PO TAB
40 mg | ORAL_TABLET | Freq: Every day | ORAL | 3 refills | 90.00000 days | Status: AC
Start: 2024-05-02 — End: ?

## 2024-05-02 NOTE — Progress Notes
 Date of Service: 05/02/2024    Patient: Joshua Keller; ZOX:[id number]; DOB: [birth year]  His PCP is Alfrieda Infante.    Referring physician:Owens, Jeralene Mom, MD         Subjective:  Joshua Keller is a 83 y.o. male who presents to the advanced heart failure clinic for Initial visit.     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  He has been referred to us  to rule out infiltrative cardiomyopathy as a cause of it.    Today, he presents to clinic alone. He has a history of atrial fibrillation and has had three pacemaker generators changed, with the most recent placed in 2022. He is not currently on any blood thinners. He experiences occasional mild chest pain in the center of his chest about once a month, which does not limit his activities. He reports occasional dizziness and lightheadedness without any falls.  He has experienced leg swelling, which has improved since starting furosemide in February. He takes 20 mg of furosemide twice daily. Occasional shortness of breath has also improved with furosemide. He remains active, including playing golf, without significant shortness of breath.  He mentioned that he does get shortness of breath on walking uphill or walking upstairs.  Denies any shortness of breath on level ground.    He is taking his medication regularly.    Of note, he mentioned that he has history of lower back pain, had undergone surgery for the same.  He also has history of carpal tunnel syndrome status post release of the right wrist.  He had numbness and tingling of the right hand.  He denies any numbness and tingling in the lower extremity.    Baseline functional status: Walks independently.        Past Medical History:    Past Medical History:    Atrial fibrillation (CMS-HCC)    CAD (coronary artery disease)    Hyperlipidemia    Hypertension          Surgical History:   Procedure Laterality Date    REMOVAL AND REPLACEMENT PERMANENT PACEMAKER GENERATOR  - DUAL LEAD SYSTEM Left 03/10/2021    Performed by Lynnetta Sauce, MD at [facility]    BACK SURGERY          Family History:  He has significant family history of congestive heart failure with his father dying of a heart attack.  His brother had LVAD in place and then heart transplant.  His uncle had heart attack.    Social History:  He is married.  He has 6 children.  He denies smoking cigarettes.  He drinks alcohol occasionally.  He denies illicit drug.  He used to work as a Pharmacist, community    Review of Systems:  General: Denies fever, chills, malaise, night sweats, significant weight change  HEENT: negative for dry/itchy eyes, congestion, sore throat  Pulm: negative for cough, hemoptysis, and shortness of breath at rest  CV: as per HPI  GI: negative for N/V, abdominal pain, blood in stools, constipation, diarrhea  GU: negative for dysuria, hematuria  MSK: History  of lower back pain, shoulder joint pain and intermittent knee pain.  Neuro: Intermittent dizziness and lightheadedness.  Heme/Lymph: negative for bleeding problems, bruising  Derm: No rashes, dry skin, or skin lesions    Medications:    Current Outpatient Medications:     acetaminophen/diphenhydramine (TYLENOL PM PO), Take 325 mg by mouth at bedtime daily., Disp: , Rfl:     aspirin EC 81 mg tablet, Take 1 Tab by mouth daily., Disp: 90 Tab, Rfl:     atorvastatin (LIPITOR) 80 mg tablet, Take one tablet by mouth daily., Disp: 90 tablet, Rfl: 3    carvediloL (COREG) 12.5 mg tablet, Take one tablet by mouth twice daily with meals. Take with food., Disp: , Rfl: furosemide (LASIX) 40 mg tablet, Take one tablet by mouth daily. For weight gain/swelling., Disp: 90 tablet, Rfl: 3    loratadine (CLARITIN) 10 mg PO tablet, Take one tablet by mouth daily., Disp: , Rfl:     nitroglycerin (NITROSTAT) 0.4 mg tablet, Place one tablet under tongue every 5 minutes as needed for Chest Pain., Disp: 25 tablet, Rfl: 3    prednisone (DELTASONE) 1 mg tablet, Take three tablets to four tablets by mouth daily with breakfast., Disp: , Rfl:     sotaloL (BETAPACE) 120 mg tablet, TAKE 1 TABLET BY MOUTH TWICE A DAY, Disp: 180 tablet, Rfl: 1    tamsulosin (FLOMAX) 0.4 mg capsule, Take one capsule by mouth daily. Do not crush, chew or open capsules. Take 30 minutes following the same meal each day., Disp: , Rfl:     Allergies:   Allergies   Allergen Reactions    Sulfamethoxazole SEE COMMENTS     Elevated pancytopenia    Sulfate Salt ANAPHYLAXIS    Trimethoprim SEE COMMENTS     Elevated pancytopenia    Chlorpromazine UNKNOWN       Objective:  BP 126/73 (BP Source: Arm, Left Upper, Patient Position: Sitting)  - Pulse 80  - Ht 170.2 cm (5' 7)  - Wt 87.1 kg (192 lb)  - SpO2 98%  - BMI 30.07 kg/m?   BP Readings from Last 3 Encounters:   05/02/24 126/73   03/14/24 128/54   01/28/24 (!) (P) 140/85      Pulse Readings from Last 3 Encounters:   05/02/24 80   03/14/24 80   01/28/24 (P) 80     Wt Readings from Last 8 Encounters:   05/02/24 87.1 kg (192 lb)   03/14/24 87.1 kg (192 lb)   01/28/24 (P) 87.5 kg (193 lb)   01/18/24 87.5 kg (193 lb)   07/01/23 85.9 kg (189 lb 6.4 oz)   12/08/22 85.3 kg (188 lb)   05/18/22 86 kg (189 lb 9.5 oz)   05/15/21 83.7 kg (184 lb 9.6 oz)     Constitutional: He appears well-developed and well-nourished.   HENT:  Head: Normocephalic.   Mouth/Throat: Oropharynx is clear and moist.   Eyes: Conjunctivae are normal.   Neck: Normal range of motion.  JVP is mildly elevated.  Cardiovascular: Normal rate, regular rhythm, normal heart sounds and intact distal pulses.  There is faint murmur heard at the aortic area.  Pulmonary/Chest: Effort normal and breath sounds normal. No respiratory distress. He has no wheezes. He has no rales. He exhibits no chest wall tenderness.   Abdominal: Soft. Bowel sounds are normal. He exhibits no distension. There is no tenderness.   Musculoskeletal: Normal range of motion and muscular tone.  There is presence of 1+ bilateral lower  send pitting edema  Neurological: He is alert and oriented to person, place, and time. No focal deficits.  Skin: Skin is warm. No erythema.   Psychiatric: He has a normal mood and affect. Judgment, behavior, and thought content normal.       Labs Reviewed:  CBC w/Diff    Lab Results   Component Value Date/Time    WBC 5.37 05/19/2022 12:00 AM    RBC 4.48 05/19/2022 12:00 AM    HGB 12.9 05/19/2022 12:00 AM    HCT 38.5 05/19/2022 12:00 AM    MCV 89.2 02/28/2021 12:00 AM    MCH 29.5 02/28/2021 12:00 AM    MCHC 33 02/28/2021 12:00 AM    RDW 13.4 02/28/2021 12:00 AM    PLTCT 140 05/19/2022 12:00 AM    MPV 7.9 07/15/2020 04:50 AM    Lab Results   Component Value Date/Time    NEUT 66 07/15/2020 04:50 AM    ANC 4.43 07/15/2020 04:50 AM    LYMA 18 (L) 07/15/2020 04:50 AM    ALC 1.19 07/15/2020 04:50 AM    MONA 10 07/15/2020 04:50 AM    AMC 0.67 07/15/2020 04:50 AM    EOSA 5 07/15/2020 04:50 AM    AEC 0.31 07/15/2020 04:50 AM    BASA 1 07/15/2020 04:50 AM    ABC 0.05 07/15/2020 04:50 AM        Comprehensive Metabolic Profile    Lab Results   Component Value Date/Time    NA 142 03/14/2024 12:00 AM    NA 142 03/14/2024 12:00 AM    K 4.3 03/14/2024 12:00 AM    K 4.3 03/14/2024 12:00 AM    K 4.6 01/15/2024 12:00 AM    K 4.9 12/14/2022 12:00 AM    CL 110 (H) 03/14/2024 12:00 AM    CL 110 (H) 03/14/2024 12:00 AM    CO2 24.0 03/14/2024 12:00 AM    CO2 24.0 03/14/2024 12:00 AM    GAP 8 03/14/2024 12:00 AM    GAP 8 03/14/2024 12:00 AM    BUN 27.6 (H) 03/14/2024 12:00 AM    BUN 27.6 (H) 03/14/2024 12:00 AM    CR 1.66 (H) 03/14/2024 12:00 AM    CR 1.66 (H) 03/14/2024 12:00 AM    CR 1.33 (H) 01/15/2024 12:00 AM    CR 1.54 (H) 12/14/2022 12:00 AM    GLU 78 03/14/2024 12:00 AM    GLU 78 03/14/2024 12:00 AM    Lab Results   Component Value Date/Time    CA 9.3 03/14/2024 12:00 AM    CA 9.3 03/14/2024 12:00 AM    PO4 4.2 07/13/2020 07:00 AM    ALBUMIN 4.0 01/15/2024 12:00 AM    TOTPROT 6.5 01/15/2024 12:00 AM    ALKPHOS 61 01/15/2024 12:00 AM    AST 14 01/15/2024 12:00 AM    ALT 16 01/15/2024 12:00 AM    TOTBILI 0.64 01/15/2024 12:00 AM    GFR 40.4 (L) 03/14/2024 12:00 AM    GFR 40.4 (L) 03/14/2024 12:00 AM    GFRAA >60 07/15/2020 04:50 AM        Cardiac Enzymes Lipids   Lab Results   Component Value Date/Time    BNP 143 (H) 03/14/2024 12:00 AM    BNP 143 (H) 03/14/2024 12:00 AM    BNP 48.0 09/20/2006 03:24 PM    TNI 1.16 (H) 07/13/2020 04:50 PM    TNI 1.57 (H) 07/13/2020 07:00 AM    TNI 1.25 (H) 07/13/2020 03:17  AM    Lab Results   Component Value Date    CHOL 133 01/15/2024    TRIG 196 (H) 01/15/2024    HDL 33 (L) 01/15/2024    LDL 61 01/15/2024    VLDL 39 01/15/2024    NONHDLCHOL 106 07/12/2020    CHOLHDLC 4 01/15/2024         Coagulation Endocrine   Lab Results   Component Value Date/Time    INR 1.1 01/03/2014 04:27 AM    INR 1.1 01/02/2014 02:48 AM    INR 1.2 01/01/2014 04:37 AM    PT 16.4 (H) 05/18/2005 08:48 AM    Lab Results   Component Value Date/Time    HGBA1C 5.8 07/13/2020 03:17 AM    HGBA1C 5.9 04/05/2014 05:30 AM    HGBA1C 5.3 12/31/2013 05:01 AM    TSH 3.52 02/02/2020 12:00 AM             Imaging and procedures reviewed:  Echo: 05/18/22:  Technically challenging study with poor acoustic windows. The cardiac chambers and valves were not well visualized. IVS of 1.4 cm and PWT of 1.3 cm  Grossly normal left ventricular size with mildly increased left ventricular wall thickness. Normal left ventricular systolic function with an estimated ejection fraction of 65 to 70%. Indeterminate left ventricular diastolic function.  Mildly dilated right ventricle with grossly normal right ventricular systolic function.  The cardiac valves are not well-visualized. No significant stenosis or regurgitation by Doppler.  No significant pericardial effusion.  Comparison is made to transthoracic study dated 07/13/2020. No significant changes were found.    Stress test: 05/19/22:  1.  Normal myocardial perfusion scan without evidence of ischemia or infarction.  2.  Normal left ventricular systolic function with normal segmental wall thickening.  There is abnormal septal wall motion consistent with patient's prior history of coronary bypass surgery.  3.  Low risk myocardial perfusion scan.    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  80% to 90% stenosis in the mid saphenous vein graft in sequence to obtuse marginal 1 and obtuse marginal 2, which was thought to be the culprit lesion, with placement of a 4.0 x 28 mm Xience Skypoint stent that was post dilated with a 4.5 NC balloon.  Patent left internal mammary artery to left anterior descending artery graft, patent saphenous vein graft to right coronary artery graft with a patent stent in its midportion.  Patent saphenous vein graft-obtuse marginal graft, status post percutaneous coronary intervention to the 80% to 90% stenosis in its mid segment.  Low left ventricular end-diastolic pressure.    1/06 - Cardiac cath: definite progression of CAD in SVG to RCA with focal 70-80%                   stenosis in mid portion of graft. Native RCA totally occluded. SVG to two Cx                   marginal branches widely patent, as was LIMA to LAD. LVEF 55%.  PCI/stent                   to SVG to RCA (bare metal stent).     12/2013 - DES to native, distal RCA beyond the insertion of vein graft Venice Gillis). Other finding: SVG patent to the OM 1 and 2 with a new eccentric 60% to 70% stenosis in the distal graft just before the 1st  anastomosis. Our recommendation is to manage this conservatively.     Immunization History   Administered Date(s) Administered    COVID-19 Bivalent (18YR+)(PFIZER), mRNA vacc, 54mcg/0.3mL 09/19/2021    Covid-19 mRNA Vaccine >=12yo (Pfizer)(Comirnaty) 09/21/2022, 09/06/2023         Assessment/Plan:  Joshua Keller is a 83 y.o. 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    #Hypertension  #ACC/AHA stage C, NYHA class III  # Hyperlipidemia  #History of CAD status post CABG in 1994, CAD status post PCI post CABG  #Right bundle branch block  - His recent echocardiogram done in June 23 showed normal EF.  It did show increased interventricular septal thickness and posterior wall thickness of 1.3 cm.  - He does have mild volume overload examination.  Plan  > We will obtain serum free light chains, serum and urine immunofixation and technetium PYP scan to rule out amyloidosis.  He does have history of atrial fibrillation, lower back pain, carpal tunnel surgery and also has increased interventricular septum and posterior wall thickness.  > We will repeat echocardiogram given that he has bilateral lower extremity edema to assess his LVEF and also any valvular lesions.  > We will check BMP, NT proBNP  > Diuretics: We will increase from 20 mg p.o. daily to 40 mg p.o. daily.  He does have bilateral lower send edema.  > For hypertension we will continue carvedilol 12.5 mg p.o. twice daily.  > For CAD continue aspirin 81 mg p.o. daily, Lipitor 80 mg daily  > Advised patient to keep an eye on his weight and if it starts going up he will let us  know.     #Paroxysmal atrial fibrillation  #Status post dual-chamber pacemaker in place  - Follows with Dr. Lander Pines.  He is on aspirin 81 mg p.o. daily and no anticoagulation given low burden of A-fib.  - Continue monitoring A-fib burden with device checks.  - Recent device setting changes were done to reduce the RV pacing.  His AV delay was increased.  Today in the clinic when EKG was done he had atrial pacing but no ventricular pacing.  Hopefully device settings will reduce his RV pacing.  - He is currently on Coreg 12.5 mg p.o. twice daily, sotalol 120 mg p.o. twice daily.    #Multiple joint pains:   #History of right-sided carpal tunnel syndrome status post surgical release  He follows with rheumatology.  He is on prednisone 4 mg p.o. daily.    Return to heart failure clinic: He has appointment coming up with Dr. Lander Pines in September and he will see me in 6 months.    Thank you for allowing me to participate in the care of this patient.  Please do not hesitate to contact me should you have any questions or concerns.         Total time spent on today's office visit was 62 minutes.  This includes face-to-face in person visit with patient as well as nonface-to-face time including review of the EMR, outside records, labs, radiologic studies, echocardiogram & other cardiovascular studies, formulation of treatment plan, after  visit summary, future disposition,  and lastly on documentation.    Radford Buffy, MD  Advanced Heart Failure and Heart Transplant Cardiologist  [facility]  [facility]  Pager: [phone]

## 2024-05-04 ENCOUNTER — Encounter: Admit: 2024-05-04 | Discharge: 2024-05-04 | Payer: MEDICARE

## 2024-05-04 ENCOUNTER — Ambulatory Visit: Admit: 2024-05-04 | Discharge: 2024-05-04 | Payer: MEDICARE

## 2024-05-04 MED ORDER — CARVEDILOL 12.5 MG PO TAB
12.5 mg | ORAL_TABLET | Freq: Two times a day (BID) | ORAL | 3 refills | 90.00000 days | Status: AC
Start: 2024-05-04 — End: ?

## 2024-05-05 ENCOUNTER — Encounter: Admit: 2024-05-05 | Discharge: 2024-05-05 | Payer: MEDICARE

## 2024-05-05 NOTE — Telephone Encounter
-----   Message from Aisha Ali, MD sent at 05/05/2024  4:14 PM CDT -----  Please let the patient know that heart function is normal.  He still has increased thickness of the left ventricle myocardium.  We will await the results of technetium PYP scan  ----- Message -----  From: Vivienne Grove, MD  Sent: 05/05/2024   3:36 PM CDT  To: Radford Buffy, MD

## 2024-05-08 ENCOUNTER — Encounter: Admit: 2024-05-08 | Discharge: 2024-05-08 | Payer: MEDICARE

## 2024-05-09 ENCOUNTER — Encounter: Admit: 2024-05-09 | Discharge: 2024-05-09 | Payer: MEDICARE

## 2024-05-11 ENCOUNTER — Encounter: Admit: 2024-05-11 | Discharge: 2024-05-11 | Payer: MEDICARE

## 2024-05-11 ENCOUNTER — Ambulatory Visit: Admit: 2024-05-11 | Discharge: 2024-05-11 | Payer: MEDICARE

## 2024-05-31 ENCOUNTER — Encounter: Admit: 2024-05-31 | Discharge: 2024-05-31 | Payer: MEDICARE

## 2024-05-31 MED ORDER — SOTALOL 120 MG PO TAB
120 mg | ORAL_TABLET | Freq: Two times a day (BID) | ORAL | 1 refills | 30.00000 days | Status: AC
Start: 2024-05-31 — End: ?

## 2024-07-03 ENCOUNTER — Encounter: Admit: 2024-07-03 | Discharge: 2024-07-03 | Payer: MEDICARE

## 2024-07-03 DIAGNOSIS — Z950 Presence of cardiac pacemaker: Principal | ICD-10-CM

## 2024-07-29 ENCOUNTER — Encounter: Admit: 2024-07-29 | Discharge: 2024-07-28 | Payer: MEDICARE

## 2024-08-08 ENCOUNTER — Encounter: Admit: 2024-08-08 | Discharge: 2024-08-08 | Payer: MEDICARE

## 2024-08-08 ENCOUNTER — Ambulatory Visit: Admit: 2024-08-08 | Discharge: 2024-08-08 | Payer: MEDICARE

## 2024-08-08 DIAGNOSIS — I5032 Chronic diastolic (congestive) heart failure: Secondary | ICD-10-CM

## 2024-08-08 DIAGNOSIS — I2510 Atherosclerotic heart disease of native coronary artery without angina pectoris: Secondary | ICD-10-CM

## 2024-08-08 DIAGNOSIS — I480 Paroxysmal atrial fibrillation: Principal | ICD-10-CM

## 2024-08-08 DIAGNOSIS — Z950 Presence of cardiac pacemaker: Secondary | ICD-10-CM

## 2024-08-08 DIAGNOSIS — I10 Essential (primary) hypertension: Secondary | ICD-10-CM

## 2024-08-08 NOTE — Assessment & Plan Note
 Last device check mid-August, 2025, showed 100% atrial pacing, battery longevity > 7 years.

## 2024-08-08 NOTE — Assessment & Plan Note
 Odd spike in BP last winter.  Has history of vaso-vagal syncope so we've never been terribly aggressive with BP management.  Carvedilol  12.5 BID, also tamsulosin  0.4 mg/day.

## 2024-08-08 NOTE — Assessment & Plan Note
 Sotalol  120 BID.  Aspirin  81 mg/day, no anti-coagulation.  PPM check 07/20/24 showed on AF over the preceding 9 months.

## 2024-08-08 NOTE — Progress Notes
 Date of Service: 08/08/2024    Joshua Keller is a 83 y.o. male.       HPI     Joshua Keller was in the [facility] clinic today for follow-up regarding his pacemaker, coronary disease, and risk factors.  He's playing golf 3 days/week.  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    He and his wife celebrated their 65th anniversary about a month ago.  All 6 of their children as well as grandchildren and great-grandchildren (every one of them!) were in [place] to help with the celebration!  It was a wonderful time!     He's still playing a lot of golf and is competitive.  He doesn't seem to be having trouble with BP like he was earlier this year.  He denies any dyspnea or chest discomfort.  He hasn't had palpitations, syncope or near syncope.         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  No aneurysm.      Left-sided weakness 04/05/2014    S/P drug eluting coronary stent placement 01/03/2014    PUD (peptic ulcer disease) 01/01/2014    Convulsion (CMS-HCC) 06/20/2013     06/05/2013 - 90-second episode of tonic-clonic motor activity and unresponsiveness.  Non-contrast CT-head in [facility] ED negative.  03/2014 - Episode of left leg weakness.  Hospitalized overnight on Neurology service at [facility].  EEG, CT-head negative.  Attributed to focal seizure.  No new meds.      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  Moderate right external carotid stenosis.  Normal vertebral arteries.    11/2016 - Carotid Duplex:  50-79% (133) RICA, 0-49% (102) LICA.        CAD (coronary artery disease) 09/10/2009     3/94 - Inferior MI.        3/94 - CABG x 4, [facility], Dr. Elsie Rase: LIMA-LAD, SSVG-2 sites of OMB,                  SVG-RCA.        10/98 - EXEC: EF 55%, mild anteroseptal hypokinesis improves with stress, non-ischemic.        4/99 - EXEC: EF 55%, non-ischemic study.        3/01 - EXEC: EF 55-60%, non-ischemic study.        5/02 - Cardiac cath, [place]: LIMA-LAD patent, SVG-RCA patent, sequential graft-OM1 and                  OM2 patent, 50% plaque in graft to RCA (not obstructed), native RCA, LAD  and LCX                   totally occluded, EF 50-55%        1/04 - Stress thallium (in-pt): EF 66%, low probability for exercise-induced ischemia.        1/06 - Cardiac cath: definite progression of CAD in SVG to RCA with focal 70-80%                   stenosis in mid portion of graft. Native RCA totally occluded. SVG to two Cx                   marginal branches widely patent, as was LIMA to LAD. LVEF 55%.  PCI/stent                   to SVG to RCA (bare metal stent).   12/2013 -    DES to native, distal RCA beyond the insertion of vein graft Zane).  Other finding:  SVG patent to the OM 1 and 2 with a new eccentric 60% to 70%                       stenosis in the distal graft just before the 1st anastomosis. Our recommendation is to manage this conservatively.  07/13/20 Echo: Left Ventricle: EF 55%. The left ventricular size is normal. Concentric remodeling. There are segmental wall motion abnormalities, as coded in the diagram below (new compared to 03/30/2013 study). Abnormal septal motion consistent with post-operative state. Left ventricular diastolic dysfunction. Right Ventricle: The right ventricular size is normal. The right ventricular systolic function is mildly reduced. No hemodynamically significant valvular abnormalities.  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 80% to 90% stenosis in the mid saphenous vein graft in sequence to obtuse marginal 1 and obtuse marginal 2, which was thought to be the culprit lesion, with placement of a 4.0 x 28 mm Xience Skypoint stent that was post dilated with a 4.5 NC balloon. Patent LIMA to LAD graft, patent saphenous vein graft to right coronary artery graft with a patent stent in its midportion. Patent saphenous vein graft-obtuse marginal graft, status post percutaneous coronary intervention to the 80% to 90% stenosis in its mid segment. Low left ventricular end-diastolic pressure.        Hypertension 09/10/2009    Hyperlipidemia 09/10/2009     3/01 - Total 165, trig 139, HDL 39, LDL 98; Baycol 0.4mg .         1/02 - Total 228, trig 144, HDL 43, LDL 155; no meds.      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      Pacemaker 09/10/2009    Atrial fibrillation (CMS-HCC) 03/18/2007     12/03 [facility] with AF; Cardizem initiated with conversion to NSR.        1/04 - Transfer [place] d/t recurrence of AF and chest pressure. Cardizem d/c'd d/t                  bradycardia after conversion to NSR.         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 Toprol  XL 100mg  initiated.      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   HENT: Negative.     Eyes: Negative.    Cardiovascular: Negative.    Respiratory: Negative.     Endocrine: Negative.    Hematologic/Lymphatic: Negative.    Skin: Negative.    Musculoskeletal: Negative.    Gastrointestinal: Negative.    Genitourinary: Negative.    Neurological: Negative.    Psychiatric/Behavioral: Negative.     Allergic/Immunologic: Negative.        Physical Exam    Physical Exam   General Appearance: no distress   Skin: warm, no ulcers or xanthomas   Digits and Nails: no cyanosis or clubbing   Eyes: conjunctivae and lids normal, pupils are equal and round   Teeth/Gums/Palate: dentition unremarkable, no lesions   Lips & Oral Mucosa: no pallor or cyanosis   Neck Veins: normal JVP , neck veins are not distended   Thyroid: no nodules, masses, tenderness or enlargement   Chest Inspection: chest is normal in appearance   Respiratory Effort: breathing comfortably, no respiratory distress   Auscultation/Percussion: lungs clear to auscultation, no rales or rhonchi, no wheezing   PMI: PMI not enlarged or displaced   Cardiac Rhythm: regular rhythm and normal rate   Cardiac Auscultation: S1, S2 normal, no rub, no gallop   Murmurs: no murmur   Peripheral Circulation: normal peripheral circulation   Carotid Arteries: normal carotid upstroke bilaterally, no bruits   Radial Arteries: normal symmetric radial pulses   Abdominal Aorta: no abdominal aortic bruit   Pedal Pulses: normal symmetric pedal pulses   Lower Extremity Edema: no lower extremity edema   Abdominal Exam: soft, non-tender, no masses, bowel sounds normal   Liver & Spleen: no organomegaly   Gait & Station: walks without assistance   Muscle Strength: normal muscle tone   Orientation: oriented to time, place and person   Affect & Mood: appropriate and sustained affect   Language and Memory: patient responsive and seems to comprehend information   Neurologic Exam: neurological assessment grossly intact   Other: moves all extremities      Cardiovascular Studies    EKG May, 2025:  A-paced rhythm with RBBB.  QTc was 485    Cardiovascular Health Factors  Vitals BP Readings from Last 3 Encounters:   08/08/24 130/76   05/04/24 136/82   05/02/24 126/73     Wt Readings from Last 3 Encounters:   08/08/24 86.2 kg (190 lb)   05/04/24 87.1 kg (192 lb 0.3 oz)   05/02/24 87.1 kg (192 lb)     BMI Readings from Last 3 Encounters:   08/08/24 29.75 kg/m?   05/04/24 30.07 kg/m?   05/02/24 30.07 kg/m?      Smoking Tobacco Use History[1]   Lipid Profile Cholesterol   Date Value Ref Range Status   01/15/2024 133  Final     HDL   Date Value Ref Range Status   01/15/2024 33 (L) >40 Final     LDL   Date Value Ref Range Status   01/15/2024 61  Final     Triglycerides   Date Value Ref Range Status   01/15/2024 196 (H) <150 Final      Blood Sugar Hemoglobin A1C   Date Value Ref Range Status   07/13/2020 5.8 4.0 - 6.0 % Final     Comment:     The ADA recommends that most patients with type 1 and type 2 diabetes maintain   an A1c level <7%.  Glucose   Date Value Ref Range Status   05/04/2024 84 70 - 100 mg/dL Final   95/91/7974 78  Final   03/14/2024 78  Final     Glucose, POC   Date Value Ref Range Status   04/05/2014 101 (H) 70 - 100 MG/DL Final          Problems Addressed Today  Encounter Diagnoses   Name Primary?    Paroxysmal atrial fibrillation (CMS-HCC) Yes    Coronary artery disease involving native coronary artery of native heart without angina pectoris     Primary hypertension     Pacemaker     Chronic heart failure with preserved ejection fraction (CMS-HCC)        Assessment and Plan       Atrial fibrillation (CMS-HCC)  Sotalol  120 BID.  Aspirin  81 mg/day, no anti-coagulation.  PPM check 07/20/24 showed on AF over the preceding 9 months.    CAD (coronary artery disease)  Last echo 05/04/24 was essentially normal, EF 65%.  Last reg/thal was June, 2023, non-ischemic.    Hypertension  Odd spike in BP last winter.  Has history of vaso-vagal syncope so we've never been terribly aggressive with BP management.  Carvedilol  12.5 BID, also tamsulosin  0.4 mg/day.    Pacemaker  Last device check mid-August, 2025, showed 100% atrial pacing, battery longevity > 7 years.    (HFpEF) heart failure with preserved ejection fraction (CMS-HCC)  Dr. Horacio saw him in May, 2025.  Amyloidosis w/up negative.  Echo essentially normal.  Furosemide  increased to 40 mg/day.    Current Medications (including today's revisions)   acetaminophen /diphenhydramine  (TYLENOL  PM PO) Take 325 mg by mouth at bedtime daily.    aspirin  EC 81 mg tablet Take 1 Tab by mouth daily.    atorvastatin  (LIPITOR) 80 mg tablet Take one tablet by mouth daily.    carvediloL  (COREG ) 12.5 mg tablet Take one tablet by mouth twice daily with meals. Take with food.    furosemide  (LASIX ) 40 mg tablet Take one tablet by mouth four times weekly. For weight gain/swelling.    loratadine (CLARITIN) 10 mg PO tablet Take one tablet by mouth daily.    nitroglycerin  (NITROSTAT ) 0.4 mg tablet Place one tablet under tongue every 5 minutes as needed for Chest Pain.    prednisone  (DELTASONE ) 1 mg tablet Take three tablets to four tablets by mouth daily with breakfast.    sotaloL  (BETAPACE ) 120 mg tablet TAKE 1 TABLET BY MOUTH TWICE A DAY    tamsulosin  (FLOMAX ) 0.4 mg capsule Take one capsule by mouth daily. Do not crush, chew or open capsules. Take 30 minutes following the same meal each day.     Total time spent on today's office visit was 45 minutes.  This includes face-to-face in person visit with patient as well as nonface-to-face time including review of the EMR, outside records, labs, radiologic studies, echocardiogram & other cardiovascular studies, formation of treatment plan, after visit summary, future disposition, and lastly on documentation.              [1]   Social History  Tobacco Use   Smoking Status Never   Smokeless Tobacco Never

## 2024-08-08 NOTE — Assessment & Plan Note
 Dr. Horacio saw him in May, 2025.  Amyloidosis w/up negative.  Echo essentially normal.  Furosemide  increased to 40 mg/day.

## 2024-08-31 ENCOUNTER — Encounter: Admit: 2024-08-31 | Discharge: 2024-08-31 | Payer: MEDICARE

## 2024-08-31 DIAGNOSIS — I4510 Unspecified right bundle-branch block: Principal | ICD-10-CM

## 2024-09-26 ENCOUNTER — Encounter: Admit: 2024-09-26 | Discharge: 2024-09-26 | Payer: MEDICARE

## 2024-09-26 DIAGNOSIS — I480 Paroxysmal atrial fibrillation: Principal | ICD-10-CM

## 2024-09-26 DIAGNOSIS — Z950 Presence of cardiac pacemaker: Secondary | ICD-10-CM

## 2024-09-26 DIAGNOSIS — E8770 Fluid overload, unspecified: Secondary | ICD-10-CM

## 2024-09-26 NOTE — Telephone Encounter
 Discussed with Dr. Quin.  In office device check ordered with device optimization ordered.

## 2024-09-26 NOTE — Telephone Encounter
 ERROR

## 2024-09-26 NOTE — Telephone Encounter
-----   Message from [place] H sent at 09/26/2024  8:17 AM CDT -----  Regarding: FW: V pacing alert    ----- Message -----  From: Rosabel Setter, RN  Sent: 09/26/2024   7:53 AM CDT  To: Setter Rosabel, RN  Subject: FW: V pacing alert                                 ----- Message -----  From: Dwan Knee  Sent: 09/26/2024   7:25 AM CDT  To: Cvm Nurse Gen Card Team Red  Subject: V pacing alert                                   Alert received for right ventricular pacing of > 40%. Pacing was 56% between Sep 25, 2024 03:41 and Sep 26, 2024 03:40. V pacing fluctuates on trend. Device could be optimized to reduce unnecessary V pacing. Overall V pacing 18% over the last 335 days. Please review with SDO and place orders for an in office check.     Thanks,  Knee

## 2024-10-07 ENCOUNTER — Encounter: Admit: 2024-10-07 | Discharge: 2024-10-08 | Payer: MEDICARE

## 2024-10-10 ENCOUNTER — Encounter: Admit: 2024-10-10 | Discharge: 2024-10-10 | Payer: MEDICARE

## 2024-10-10 ENCOUNTER — Ambulatory Visit: Admit: 2024-10-10 | Discharge: 2024-10-10 | Payer: MEDICARE

## 2024-10-10 DIAGNOSIS — Z950 Presence of cardiac pacemaker: Principal | ICD-10-CM

## 2024-10-11 ENCOUNTER — Encounter: Admit: 2024-10-11 | Discharge: 2024-10-11 | Payer: MEDICARE

## 2024-10-17 ENCOUNTER — Encounter: Admit: 2024-10-17 | Discharge: 2024-10-17 | Payer: MEDICARE

## 2024-10-19 ENCOUNTER — Encounter: Admit: 2024-10-19 | Discharge: 2024-10-19 | Payer: MEDICARE

## 2024-10-25 ENCOUNTER — Encounter: Admit: 2024-10-25 | Discharge: 2024-10-25 | Payer: MEDICARE

## 2024-10-25 MED ORDER — ATORVASTATIN 80 MG PO TAB
80 mg | ORAL_TABLET | Freq: Every day | ORAL | 3 refills | 90.00000 days | Status: AC
Start: 2024-10-25 — End: ?

## 2024-10-31 ENCOUNTER — Encounter: Admit: 2024-10-31 | Discharge: 2024-10-31 | Payer: MEDICARE

## 2024-11-13 ENCOUNTER — Encounter: Admit: 2024-11-13 | Discharge: 2024-11-13 | Payer: MEDICARE

## 2024-11-13 NOTE — Telephone Encounter [36]
-----   Message from [place] H sent at 11/13/2024 12:58 PM CST -----  Regarding: FW: SDO- AFL episode for 2 min 41 sec with No OAC in chart    ----- Message -----  From: [place] Fly  Sent: 11/13/2024  12:55 PM CST  To: Cvm Nurse Gen Card Team Red  Subject: SDO- AFL episode for 2 min 41 sec with No OA#    Stored EGMs are consistent with or suggestive of Atrial Flutter  Total number of events: 1 episode on 11/01/24 for 2 min 41 sec with V rates 120s bpm, Atrial rates 200s bpm  No OAC in chart    Report is sent to chart    Please check  Thanks  Gertrude / Device team

## 2024-11-13 NOTE — Telephone Encounter [36]
 Called pt to check on symptoms. Pt states he doesn't remember 11/26 having any symptoms related to the alert on his pacemaker. Pt did state that approximately once a week he has a lightheaded spell. Pt not on OAC, just ASA 81mg . Will route to Dr. Quin.

## 2024-11-13 NOTE — Telephone Encounter [36]
-----   Message -----  From: Quin Elspeth BIRCH, MD  Sent: 11/13/2024   3:29 PM CST  To: Harlene Plater, RN    No change in medications based on this very minimal episode of AF.

## 2024-11-21 ENCOUNTER — Encounter: Admit: 2024-11-21 | Discharge: 2024-11-21 | Payer: MEDICARE

## 2024-11-21 DIAGNOSIS — Z950 Presence of cardiac pacemaker: Principal | ICD-10-CM

## 2024-11-22 ENCOUNTER — Encounter: Admit: 2024-11-22 | Discharge: 2024-11-22 | Payer: MEDICARE

## 2024-11-27 ENCOUNTER — Encounter: Admit: 2024-11-27 | Discharge: 2024-11-27 | Payer: MEDICARE

## 2024-11-27 NOTE — Telephone Encounter [36]
-----   Message from Vernell SQUIBB sent at 11/21/2024  9:33 AM CST -----  Regarding: FW: V pacing alert  Please see TRD recommendations. Will order the device interrogation  ----- Message -----  From: Horacio Lords, MD  Sent: 11/21/2024   8:46 AM CST  To: Vernell Bateman, RN  Subject: RE: V pacing alert                               Lets reduce the lower rate limit to 60 beats a minute and also increase the AV delay to 400 ms.  Okay with placing device interrogation.  ----- Message -----  From: Bateman Vernell, RN  Sent: 11/21/2024   7:22 AM CST  To: Lords Horacio, MD  Subject: EMELDA: V pacing alert                                 ----- Message -----  From: Dwan Knee  Sent: 11/21/2024   7:17 AM CST  To: Cvm Nurse Hf Team Cor[place]  Subject: V pacing alert                                    Alert received for right ventricular pacing of > 40%. Pacing was 41% between Nov 20, 2024 04:50 and Nov 21, 2024 04:50. V pacing has been variable on trend. Lower rate is currently set to 80 bpm. Intrinsic conduction might come through at a lower rate. AV search+ is currently on to 370 ms. Patient would benefit from extending this to 400 ms (max). Patient is seeing Dr. Horacio at the [facility] office tomorrow. Please place orders and have patient scheduled at [facility] tomorrow for device check to see if we can reduce any unnecessary V pacing that he is having. Overall V pacing 31% since 10/10/24.    Thanks,  Knee

## 2024-11-28 ENCOUNTER — Encounter: Admit: 2024-11-28 | Discharge: 2024-11-28 | Payer: MEDICARE

## 2024-11-28 ENCOUNTER — Ambulatory Visit: Admit: 2024-11-28 | Discharge: 2024-11-28 | Payer: MEDICARE

## 2024-11-28 MED ORDER — SOTALOL 120 MG PO TAB
120 mg | ORAL_TABLET | Freq: Two times a day (BID) | ORAL | 3 refills | 30.00000 days | Status: AC
Start: 2024-11-28 — End: ?

## 2024-11-29 ENCOUNTER — Encounter: Admit: 2024-11-29 | Discharge: 2024-11-29 | Payer: MEDICARE

## 2024-12-06 ENCOUNTER — Encounter: Admit: 2024-12-06 | Discharge: 2024-12-06 | Payer: MEDICARE

## 2024-12-21 ENCOUNTER — Ambulatory Visit: Admit: 2024-12-21 | Discharge: 2024-12-21 | Payer: MEDICARE

## 2024-12-21 ENCOUNTER — Encounter: Admit: 2024-12-21 | Discharge: 2024-12-21 | Payer: MEDICARE

## 2024-12-21 DIAGNOSIS — Z955 Presence of coronary angioplasty implant and graft: Principal | ICD-10-CM

## 2024-12-21 DIAGNOSIS — I10 Essential (primary) hypertension: Secondary | ICD-10-CM

## 2024-12-21 DIAGNOSIS — Z950 Presence of cardiac pacemaker: Secondary | ICD-10-CM

## 2024-12-21 DIAGNOSIS — I5032 Chronic diastolic (congestive) heart failure: Secondary | ICD-10-CM

## 2024-12-21 DIAGNOSIS — I2119 ST elevation (STEMI) myocardial infarction involving other coronary artery of inferior wall: Secondary | ICD-10-CM

## 2024-12-21 MED ORDER — ISOSORBIDE MONONITRATE 30 MG PO TB24
15 mg | ORAL_TABLET | Freq: Every morning | ORAL | 3 refills | 90.00000 days | Status: AC
Start: 2024-12-21 — End: ?

## 2024-12-21 NOTE — Telephone Encounter [36]
 Pt called reporting his pacemaker was adjusted when he had recent stress test ordered by Dr. Horacio. Pt report feeling increased fatigue and asks if device can be adjusted again.

## 2024-12-21 NOTE — Progress Notes [1]
 [facility] Consult note    Date of Service: 12/21/2024    Patient: Joshua Keller; FMW:[id number]; DOB: 03/17/1941  His PCP is Venson Riis.    Referring physician: Quin Elspeth BIRCH, MD         Subjective:  This is an urgent visit in the ER at [facility] for chest pain.  Joshua Keller is a 84 y.o. male who presents to the advanced heart failure clinic for follow-up visit.     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    Patient mentioned that he has been having intermittent chest pain on the left side of the chest which last for few minutes and then goes away by itself.  It is not triggered by any particular activity.  It can even happen at activity or at rest.  He denies any loss of consciousness.  He has mild shortness of breath, denies any palpitations.  Denies any ICD shocks.  Due to ongoing chest pain with his history of CABG she was sent to ER for ruling out ACS.  During my examination he appears to be sitting comfortable without any significant distress.     Patient noticed that he felt more fatigued and tired since device settings were changed to reduce the lower heart rate limit to 60 beats a minute on 11/21/24.     He enjoys golfing.    Baseline functional status: Walks independently.        Past Medical History:    Past Medical History:    Atrial fibrillation (CMS-HCC)    CAD (coronary artery disease)    Hyperlipidemia    Hypertension          Surgical History:   Procedure Laterality Date    REMOVAL AND REPLACEMENT PERMANENT PACEMAKER GENERATOR  - DUAL LEAD SYSTEM Left 03/10/2021    Performed by Laurian Armin BROCKS, MD at [facility] EP LAB    BACK SURGERY          Family History:  He has significant family history of congestive heart failure with his father dying of a heart attack.  His brother had LVAD in place and then heart transplant.  His uncle had heart attack.    Social History:  He is married.  He has 6 children.  He denies smoking cigarettes.  He drinks alcohol occasionally.  He denies illicit drug.  He used to work as a pharmacist, community    Review of Systems:  General: Denies fever, chills, malaise, night sweats, significant weight change  HEENT: negative for dry/itchy eyes, congestion, sore throat  Pulm: negative for cough, hemoptysis, and shortness of breath at rest  CV: as per HPI  GI: negative for N/V, abdominal pain, blood in stools, constipation, diarrhea  GU: negative for dysuria, hematuria  MSK: History of lower back pain, shoulder joint pain and intermittent knee pain.  Neuro: Intermittent dizziness and lightheadedness.  Heme/Lymph: negative for bleeding problems, bruising  Derm: No rashes, dry skin, or skin lesions    Medications:    Current Outpatient Medications:  acetaminophen /diphenhydramine  (TYLENOL  PM PO), Take 325 mg by mouth at bedtime daily., Disp: , Rfl:     aspirin  EC 81 mg tablet, Take 1 Tab by mouth daily., Disp: 90 Tab, Rfl:     atorvastatin  (LIPITOR) 80 mg tablet, TAKE 1 TABLET BY MOUTH EVERY DAY, Disp: 90 tablet, Rfl: 3    carvediloL  (COREG ) 12.5 mg tablet, Take one tablet by mouth twice daily with meals. Take with food. (Patient taking differently: Take one tablet by mouth twice daily with meals. Take with food. Taking One TAB PO IN AM), Disp: 90 tablet, Rfl: 3    empagliflozin (JARDIANCE) 10 mg tablet, Take one tablet by mouth daily., Disp: 90 tablet, Rfl: 3    furosemide  (LASIX ) 40 mg tablet, Take one tablet by mouth four times weekly. For weight gain/swelling., Disp: 100 tablet, Rfl: 1    loratadine (CLARITIN) 10 mg PO tablet, Take one tablet by mouth daily., Disp: , Rfl:     nitroglycerin  (NITROSTAT ) 0.4 mg tablet, Place one tablet under tongue every 5 minutes as needed for Chest Pain., Disp: 25 tablet, Rfl: 3    prednisone  (DELTASONE ) 1 mg tablet, Take three tablets to four tablets by mouth daily with breakfast., Disp: , Rfl:     sotalol  (BETAPACE ) 120 mg tablet, TAKE 1 TABLET BY MOUTH TWICE A DAY, Disp: 180 tablet, Rfl: 3    tamsulosin  (FLOMAX ) 0.4 mg capsule, Take one capsule by mouth daily. Do not crush, chew or open capsules. Take 30 minutes following the same meal each day., Disp: , Rfl:     Allergies:   Allergies   Allergen Reactions    Sulfamethoxazole SEE COMMENTS     Elevated pancytopenia    Sulfate Salt ANAPHYLAXIS    Trimethoprim SEE COMMENTS     Elevated pancytopenia    Chlorpromazine UNKNOWN       Objective:  There were no vitals taken for this visit.  BP Readings from Last 3 Encounters:   11/22/24 112/70   08/08/24 130/76   05/04/24 136/82      Pulse Readings from Last 3 Encounters:   11/22/24 80   08/08/24 66   05/02/24 80     Wt Readings from Last 8 Encounters:   11/22/24 83.5 kg (184 lb)   08/08/24 86.2 kg (190 lb)   05/04/24 87.1 kg (192 lb 0.3 oz)   05/02/24 87.1 kg (192 lb)   03/14/24 87.1 kg (192 lb)   01/28/24 (P) 87.5 kg (193 lb)   01/18/24 87.5 kg (193 lb)   07/01/23 85.9 kg (189 lb 6.4 oz)     Constitutional: He appears well-developed and well-nourished.   HENT:  Head: Normocephalic.   Mouth/Throat: Oropharynx is clear and moist.   Eyes: Conjunctivae are normal.   Neck: Normal range of motion.  JVP is normal.  Cardiovascular: Normal rate, regular rhythm, normal heart sounds and intact distal pulses.  There is faint murmur heard at the aortic area.  Pulmonary/Chest: Effort normal and breath sounds normal. No respiratory distress. He has no wheezes. He has no rales. He exhibits no chest wall tenderness.   Abdominal: Soft. Bowel sounds are normal. He exhibits no distension. There is no tenderness.   Musculoskeletal: Normal range of motion and muscular tone.  There is no lower limb edema.  Neurological: He is alert and oriented to person, place, and time. No focal deficits.  Skin: Skin is warm. No erythema.   Psychiatric: He has a normal mood and affect. Judgment, behavior, and thought  content normal.       Labs Reviewed:  CBC w/Diff    Lab Results   Component Value Date/Time    WBC 5.37 05/19/2022 12:00 AM    RBC 4.48 05/19/2022 12:00 AM    HGB 12.9 05/19/2022 12:00 AM    HCT 38.5 05/19/2022 12:00 AM    MCV 89.2 02/28/2021 12:00 AM    MCH 29.5 02/28/2021 12:00 AM    MCHC 33 02/28/2021 12:00 AM    RDW 13.4 02/28/2021 12:00 AM    PLTCT 140 05/19/2022 12:00 AM    MPV 7.9 07/15/2020 04:50 AM    Lab Results   Component Value Date/Time    NEUT 66 07/15/2020 04:50 AM    ANC 4.43 07/15/2020 04:50 AM    LYMA 18 (L) 07/15/2020 04:50 AM    ALC 1.19 07/15/2020 04:50 AM    MONA 10 07/15/2020 04:50 AM    AMC 0.67 07/15/2020 04:50 AM    EOSA 5 07/15/2020 04:50 AM    AEC 0.31 07/15/2020 04:50 AM    BASA 1 07/15/2020 04:50 AM    ABC 0.05 07/15/2020 04:50 AM        Comprehensive Metabolic Profile    Lab Results   Component Value Date/Time    NA 140 05/04/2024 02:39 PM    K 4.3 05/04/2024 02:39 PM    K 4.3 03/14/2024 12:00 AM    K 4.3 03/14/2024 12:00 AM    K 4.6 01/15/2024 12:00 AM    CL 101 05/04/2024 02:39 PM    CO2 26 05/04/2024 02:39 PM    GAP 13 (H) 05/04/2024 02:39 PM    BUN 40 (H) 05/04/2024 02:39 PM    CR 1.75 (H) 05/04/2024 02:39 PM    CR 1.66 (H) 03/14/2024 12:00 AM    CR 1.66 (H) 03/14/2024 12:00 AM    CR 1.33 (H) 01/15/2024 12:00 AM    GLU 84 05/04/2024 02:39 PM    Lab Results   Component Value Date/Time    CA 9.6 05/04/2024 02:39 PM    PO4 4.2 07/13/2020 07:00 AM    ALBUMIN 4.0 01/15/2024 12:00 AM    TOTPROT 6.5 01/15/2024 12:00 AM    ALKPHOS 61 01/15/2024 12:00 AM    AST 14 01/15/2024 12:00 AM    ALT 16 01/15/2024 12:00 AM    TOTBILI 0.64 01/15/2024 12:00 AM    GFR 38 (L) 05/04/2024 02:39 PM    GFRAA >60 07/15/2020 04:50 AM        Cardiac Enzymes Lipids   Lab Results   Component Value Date/Time    BNP 143 (H) 03/14/2024 12:00 AM    BNP 143 (H) 03/14/2024 12:00 AM    BNP 48.0 09/20/2006 03:24 PM    TNI 1.16 (H) 07/13/2020 04:50 PM    TNI 1.57 (H) 07/13/2020 07:00 AM    TNI 1.25 (H) 07/13/2020 03:17 AM    Lab Results   Component Value Date    CHOL 133 01/15/2024    TRIG 196 (H) 01/15/2024    HDL 33 (L) 01/15/2024    LDL 61 01/15/2024    VLDL 39 01/15/2024    NONHDLCHOL 106 07/12/2020    CHOLHDLC 4 01/15/2024         Coagulation Endocrine   Lab Results   Component Value Date/Time    INR 1.1 01/03/2014 04:27 AM    INR 1.1 01/02/2014 02:48 AM    INR 1.2 01/01/2014 04:37 AM    PT 16.4 (H) 05/18/2005 08:48  AM    Lab Results   Component Value Date/Time    HGBA1C 5.8 07/13/2020 03:17 AM    HGBA1C 5.9 04/05/2014 05:30 AM    HGBA1C 5.3 12/31/2013 05:01 AM    TSH 3.52 02/02/2020 12:00 AM             Imaging and procedures reviewed:  Echo:   04/2024:  Normal sized left ventricle. Normal LV wall thickness.  Prominent isolated basal septal hypertrophy  Normal left ventricular systolic function with estimated ejection fraction of 60-65% by visual assessment. No regional wall motion abnormalities.   Normal diastolic function with normal estimated left atrial pressure.  Normal sized right ventricle.  Mildly reduced right ventricular systolic function.  Normal sized left atrium. Normal sized right atrium..   Mild tricuspid regurgitation.  No evidence of pulmonary hypertension with estimated PA systolic pressure of 21 mmHg  No pericardial effusion.     Please see full report for additional details.    Prior study for comparison: 05/18/2022.  No significant change compared to prior echo.    05/18/22:  Technically challenging study with poor acoustic windows. The cardiac chambers and valves were not well visualized. IVS of 1.4 cm and PWT of 1.3 cm  Grossly normal left ventricular size with mildly increased left ventricular wall thickness. Normal left ventricular systolic function with an estimated ejection fraction of 65 to 70%. Indeterminate left ventricular diastolic function.  Mildly dilated right ventricle with grossly normal right ventricular systolic function.  The cardiac valves are not well-visualized. No significant stenosis or regurgitation by Doppler.  No significant pericardial effusion.  Comparison is made to transthoracic study dated 07/13/2020. No significant changes were found.    Stress test: 05/19/22:  1.  Normal myocardial perfusion scan without evidence of ischemia or infarction.  2.  Normal left ventricular systolic function with normal segmental wall thickening.  There is abnormal septal wall motion consistent with patient's prior history of coronary bypass surgery.  3.  Low risk myocardial perfusion scan.    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  80% to 90% stenosis in the mid saphenous vein graft in sequence to obtuse marginal 1 and obtuse marginal 2, which was thought to be the culprit lesion, with placement of a 4.0 x 28 mm Xience Skypoint stent that was post dilated with a 4.5 NC balloon.  Patent left internal mammary artery to left anterior descending artery graft, patent saphenous vein graft to right coronary artery graft with a patent stent in its midportion.  Patent saphenous vein graft-obtuse marginal graft, status post percutaneous coronary intervention to the 80% to 90% stenosis in its mid segment.  Low left ventricular end-diastolic pressure.    1/06 - Cardiac cath: definite progression of CAD in SVG to RCA with focal 70-80%                   stenosis in mid portion of graft. Native RCA totally occluded. SVG to two Cx                   marginal branches widely patent, as was LIMA to LAD. LVEF 55%.  PCI/stent                   to SVG to RCA (bare metal stent).     12/2013 - DES to native, distal RCA beyond the insertion of vein graft Zane). Other finding: SVG patent to the OM 1 and  2 with a new eccentric 60% to 70% stenosis in the distal graft just before the 1st anastomosis. Our recommendation is to manage this conservatively.     05/11/24: [employer]etium PYP scan  SUMMARY/OPINION:   This study is negative and not suggestive of ATTR cardiac amyloidosis    AL amyloidosis also ruled out.    11/28/24: MPI stress test:  SUMMARY/OPINION:  This study is probably normal with no evidence of significant myocardial ischemia.  Diaphragmatic attenuation was noted with radiotracer inhomogeneity involving the basal inferolateral segment.  Regional global left ventricular systolic function is normal. There are no high risk prognostic indicators present.  The pharmacologic ECG portion of the study is negative for ischemia.     When compared to prior study dated 05/19/2022, prior study was interpreted as being normal with no significant perfusion abnormality.  EF was 55% in previous study.  Images are not available for side-to-side comparison of the perfusion patterns.    Immunization History   Administered Date(s) Administered    COVID-19 Bivalent (73YR+)(PFIZER), mRNA vacc, 30mcg/0.3mL 09/19/2021    Covid-19 mRNA Vaccine >=12yo (Pfizer)(Comirnaty) 09/21/2022, 09/06/2023, 09/20/2024     His labs from today were reviewed.  Troponin was negative.  Hemoglobin 13.1, WBC count 4.5, platelet count 140. sodium 142, potassium 3.8, chloride 107, bicarb 21, BUN 34, creatinine 1.7 which is stable for him.  Normal LFTs.    Assessment/Plan:  Joshua Keller is a 84 y.o. 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 #Hypertension  #ACC/AHA stage C, NYHA class III  # Hyperlipidemia  #History of CAD status post CABG in 1994, CAD status post PCI post CABG  #Right bundle branch block  # Chronic HFpEF  #Intermittent chest pain  -Amyloidosis workup has been negative in June 2025  -Echocardiogram in May, 2025 shows normal LVEF and increased LV thickness.  History of hypertension.  -He had recent MPI stress test done in December 2025 which shows no significant ischemia.  Details mentioned above.  -His EKG done today in the ER shows A paced with right bundle branch block.  Heart rate of 60 bpm.  -Chest x-ray shows no acute cardiopulmonary normality.  Plan  > Negative troponin and unchanged EKG from the prior is reassuring.  It be possible that patient has stable angina and hence we will start patient on Imdur  15 mg p.o. daily.  Recent stress test shows no high risk features.  We can continue medical therapy and no need to admit.  I do think the patient needs coronary angiogram at this stage.  I think he can be discharged from the ER and continue follow-up with us  in the clinic.  > For CAD continue aspirin  81 mg p.o. daily, Lipitor 80 mg daily  > For hypertension we will continue carvedilol  12.5 mg p.o. twice daily.   > Diuretics: lasix  40 mg p.o. daily.    > Continue Jardiance 10 mg p.o. daily for the HFpEF       #Paroxysmal atrial fibrillation  #Status post dual-chamber pacemaker in place  #Fatigue  - Follows with Dr. Quin.  He is on aspirin  81 mg p.o. daily and no anticoagulation given low burden of A-fib.  -  Continue monitoring A-fib burden with device checks.  > Due to worsening of fatigue with reducing lower rate limit to 60 beats a minute, we today interrogated his device and increased lower rate limit to 80 beats a minute.  Will keep the AV delay at 400 ms to promote intrinsic AV conduction.  - He is currently on Coreg  12.5 mg p.o. twice daily, sotalol  120 mg p.o. twice daily.    #Multiple joint pains:   #History of right-sided carpal tunnel syndrome status post surgical release  He follows with rheumatology.  He is on prednisone  4 mg p.o. daily.    Return to heart failure clinic: NP in 3 months and me in 6 months    Thank you for allowing me to participate in the care of this patient.  Please do not hesitate to contact me should you have any questions or concerns.         Total Time Today was 82 minutes in the following activities: Preparing to see the patient, Obtaining and/or reviewing separately obtained history, Performing a medically appropriate examination and/or evaluation, performing device interrogation, counseling and educating the patient/family/caregiver, Ordering medications, tests, or procedures, Referring and communication with other health care professionals (when not separately reported), Documenting clinical information in the electronic or other health record, Independently interpreting results (not separately reported) and communicating results to the patient/family/caregiver, and Care coordination (not separately reported)      Velva Pate, MD  Advanced Heart Failure and Heart Transplant Cardiologist  [facility]  [facility]  Pager: [phone]  Available on Voalte

## 2024-12-21 NOTE — Telephone Encounter [36]
 Called and spoke with patient on authorized line using two patient identifiers. Patient informed this RN that he has been more fatigued lately since his device was adjusted. Patient also reports some intermittent left sided chest pain that he rates 4/10 that does not get worse with activity. Patient further states that he chest pain get better after he rests for a little bit. Patient states that he has been taking all cardiac medications as prescribed including Coreg  12.5 mg twice a day. See blood pressures and weights below. Informed patient that due to his cardiac history it would be best for him to proceed to the emergency department due to his extensive cardiac history. Explained the risks to patient and patient does not wish to proceed to the emergency room. Informed patient that this RN will review with Dr. Horacio, though the recommendation might be the same, and that this RN will get back to him. Patient verbalizes understanding and denies additional needs at this time.       1/15- hasn't checked yet   1/14- 145/73 60  1/13- 155/62 60     1/14- 184 lbs

## 2024-12-21 NOTE — Telephone Encounter [36]
 Patient seen in consult.  Device adjusted.  Recommend imdur  15mg  daily.  Sent to pharmacy.

## 2024-12-21 NOTE — Telephone Encounter [36]
 Called and spoke with patient on authorized line using two patient identifiers. Informed patient per Dr. Horacio that he is to proceed to the nearest emergency department for evaluation of chest pain. Patient stated that he will go now and that he is going to [facility] emergency room. Requested that patient call us  when he is discharged so that we can request the records. Patient verbalized understanding and denies additional needs at this time.

## 2025-01-05 ENCOUNTER — Encounter: Admit: 2025-01-05 | Discharge: 2025-01-05 | Payer: MEDICARE

## 2025-01-11 ENCOUNTER — Encounter: Admit: 2025-01-11 | Discharge: 2025-01-11 | Payer: MEDICARE

## 2025-01-11 DIAGNOSIS — E8770 Fluid overload, unspecified: Secondary | ICD-10-CM

## 2025-01-11 DIAGNOSIS — Z955 Presence of coronary angioplasty implant and graft: Secondary | ICD-10-CM

## 2025-01-11 DIAGNOSIS — I10 Essential (primary) hypertension: Secondary | ICD-10-CM

## 2025-01-11 DIAGNOSIS — Z951 Presence of aortocoronary bypass graft: Secondary | ICD-10-CM

## 2025-01-11 DIAGNOSIS — E785 Hyperlipidemia, unspecified: Secondary | ICD-10-CM

## 2025-01-11 DIAGNOSIS — I480 Paroxysmal atrial fibrillation: Secondary | ICD-10-CM

## 2025-01-11 DIAGNOSIS — I5032 Chronic diastolic (congestive) heart failure: Principal | ICD-10-CM

## 2025-01-11 DIAGNOSIS — I2510 Atherosclerotic heart disease of native coronary artery without angina pectoris: Secondary | ICD-10-CM

## 2025-01-11 MED ORDER — CARVEDILOL 12.5 MG PO TAB
12.5 mg | ORAL_TABLET | Freq: Two times a day (BID) | ORAL | 3 refills | 90.00000 days | Status: AC
Start: 2025-01-11 — End: ?

## 2025-01-11 MED ORDER — FUROSEMIDE 40 MG PO TAB
40-80 mg | ORAL_TABLET | ORAL | 3 refills | 70.00000 days | Status: AC
Start: 2025-01-11 — End: ?

## 2025-01-11 NOTE — Progress Notes [1]
 Date of Service: 01/11/2025    Patient: Alquan Morrish; FMW:[id number]; DOB: [birth year]  His PCP is Venson Riis.    Referring physician: Quin Elspeth BIRCH, MD         Subjective:  Talha Iser Berntsen is a 84 y.o. male who presents to the advanced heart failure clinic for follow-up visit.     Patient mentioned that he has moved to assisted living since January 26.    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  His PYP scan was negative for amyloidosis.    He was recently seen in the ER on 12/21/24. patient mentioned that he has been having intermittent chest pain on the left side of the chest which last for few minutes and then goes away by itself.  It is not triggered by any particular activity.  INegative troponin and unchanged EKG from the prior is reassuring, showed A pacing with right bundle branch block..  Denies any ICD shocks.  He was discharged on Imdur .    Patient noticed that he felt more fatigued and tired since device settings were changed to reduce the lower heart rate limit to 60 beats a minute on 11/21/24.   We increased the lower pacing rate from 60 bpm to 80 bpm due to fatigue and tiredness.  We kept the AV delay at 400 ms on 12/21/24.      Today, patient presents to clinic for the follow-up visit. Since the last visit his chest symptoms have improved, with no recurrence of chest pain after the emergency room visit. He has dizziness when climbing stairs since moving to assisted living.   His pacemaker heart rate was increased from 60 to 80 beats per minute, which reduced his fatigue.  He noticed finger swelling this morning and takes daily furosemide . He has no significant shortness of breath except with substantial exertion.  He also has some bilateral lower extremity swelling.    He enjoys golfing.    Baseline functional status: Walks independently.        Past Medical History:    Past Medical History:    Atrial fibrillation (CMS-HCC)    CAD (coronary artery disease)    Hyperlipidemia    Hypertension          Surgical History:   Procedure Laterality Date    REMOVAL AND REPLACEMENT PERMANENT PACEMAKER GENERATOR  - DUAL LEAD SYSTEM Left 03/10/2021    Performed by Laurian Armin BROCKS, MD at [facility] EP LAB    BACK SURGERY          Family History:  He has significant family history of congestive heart failure with his father dying of a heart attack.  His brother had LVAD in place and then heart transplant.  His uncle had heart attack.    Social History:  He is married.  He has 6 children.  He denies smoking cigarettes.  He drinks alcohol occasionally.  He denies illicit drug.  He used to work as a pharmacist, community    Review of Systems:  General: Denies fever, chills, malaise,  night sweats, significant weight change  HEENT: negative for dry/itchy eyes, congestion, sore throat  Pulm: negative for cough, hemoptysis, and shortness of breath at rest  CV: as per HPI  GI: negative for N/V, abdominal pain, blood in stools, constipation, diarrhea  GU: negative for dysuria, hematuria  MSK: History of lower back pain, shoulder joint pain and intermittent knee pain.  Neuro: Intermittent dizziness and lightheadedness.  Heme/Lymph: negative for bleeding problems, bruising  Derm: No rashes, dry skin, or skin lesions    Medications:    Current Outpatient Medications:     acetaminophen /diphenhydramine  (TYLENOL  PM PO), Take 325 mg by mouth at bedtime daily., Disp: , Rfl:     aspirin  EC 81 mg tablet, Take 1 Tab by mouth daily., Disp: 90 Tab, Rfl:     atorvastatin  (LIPITOR) 80 mg tablet, TAKE 1 TABLET BY MOUTH EVERY DAY, Disp: 90 tablet, Rfl: 3    carvediloL  (COREG ) 12.5 mg tablet, Take one tablet by mouth twice daily with meals. Take with food., Disp: 90 tablet, Rfl: 3    empagliflozin (JARDIANCE) 10 mg tablet, Take one tablet by mouth daily., Disp: 90 tablet, Rfl: 3    furosemide  (LASIX ) 40 mg tablet, Take one tablet by mouth four times weekly. For weight gain/swelling., Disp: 100 tablet, Rfl: 1    isosorbide  mononitrate ER (IMDUR ) 30 mg tablet,extended release 24 hr, Take one-half tablet by mouth every morning., Disp: 45 tablet, Rfl: 3    loratadine (CLARITIN) 10 mg PO tablet, Take one tablet by mouth daily., Disp: , Rfl:     nitroglycerin  (NITROSTAT ) 0.4 mg tablet, Place one tablet under tongue every 5 minutes as needed for Chest Pain., Disp: 25 tablet, Rfl: 3    prednisone  (DELTASONE ) 1 mg tablet, Take three tablets to four tablets by mouth daily with breakfast., Disp: , Rfl:     sotalol  (BETAPACE ) 120 mg tablet, TAKE 1 TABLET BY MOUTH TWICE A DAY, Disp: 180 tablet, Rfl: 3    tamsulosin  (FLOMAX ) 0.4 mg capsule, Take one capsule by mouth daily. Do not crush, chew or open capsules. Take 30 minutes following the same meal each day., Disp: , Rfl:     Allergies:   Allergies   Allergen Reactions    Sulfamethoxazole SEE COMMENTS     Elevated pancytopenia    Sulfate Salt ANAPHYLAXIS    Trimethoprim SEE COMMENTS     Elevated pancytopenia    Chlorpromazine UNKNOWN       Objective:  BP 117/74 (BP Source: Arm, Right Upper, Patient Position: Sitting)  - Pulse 83  - Ht 170.2 cm (5' 7)  - Wt 84.6 kg (186 lb 6.4 oz)  - SpO2 94%  - BMI 29.19 kg/m?   BP Readings from Last 3 Encounters:   01/11/25 117/74   11/22/24 112/70   08/08/24 130/76      Pulse Readings from Last 3 Encounters:   01/11/25 83   11/22/24 80   08/08/24 66     Wt Readings from Last 8 Encounters:   01/11/25 84.6 kg (186 lb 6.4 oz)   11/22/24 83.5 kg (184 lb)   08/08/24 86.2 kg (190 lb)   05/04/24 87.1 kg (192 lb 0.3 oz)   05/02/24 87.1 kg (192 lb)   03/14/24 87.1 kg (192 lb)   01/28/24 (P) 87.5 kg (193 lb)   01/18/24 87.5 kg (193 lb)     Constitutional: He appears well-developed and well-nourished.   HENT:  Head: Normocephalic.   Mouth/Throat: Oropharynx is clear and  moist.   Eyes: Conjunctivae are normal.   Neck: Normal range of motion.  JVP appears to be elevated.  Cardiovascular: Normal rate, regular rhythm, normal heart sounds and intact distal pulses.  There is faint murmur heard at the aortic area.  Pulmonary/Chest: Effort normal and breath sounds normal. No respiratory distress. He has no wheezes. He has no rales. He exhibits no chest wall tenderness.   Abdominal: Soft. Bowel sounds are normal. He exhibits no distension. There is no tenderness.   Musculoskeletal: Normal range of motion and muscular tone.  There is presence of 1+ bilateral lower limb edema.  Neurological: He is alert and oriented to person, place, and time. No focal deficits.  Skin: Skin is warm. No erythema.   Psychiatric: He has a normal mood and affect. Judgment, behavior, and thought content normal.       Labs Reviewed:  CBC w/Diff    Lab Results   Component Value Date/Time    WBC 6.17 12/21/2024 12:00 AM    RBC 4.55 (L) 12/21/2024 12:00 AM    HGB 13.1 (L) 12/21/2024 12:00 AM    HCT 39.6 (L) 12/21/2024 12:00 AM    MCV 87.0 12/21/2024 12:00 AM    MCH 28.8 12/21/2024 12:00 AM    MCHC 33.1 12/21/2024 12:00 AM    RDW 14.3 12/21/2024 12:00 AM    PLTCT 140 (L) 12/21/2024 12:00 AM    MPV 10.4 12/21/2024 12:00 AM    Lab Results   Component Value Date/Time    NEUT 66 07/15/2020 04:50 AM    ANC 4.43 07/15/2020 04:50 AM    LYMA 18 (L) 07/15/2020 04:50 AM    ALC 1.19 07/15/2020 04:50 AM    MONA 10 07/15/2020 04:50 AM    AMC 0.67 07/15/2020 04:50 AM    EOSA 5 07/15/2020 04:50 AM    AEC 0.31 07/15/2020 04:50 AM    BASA 1 07/15/2020 04:50 AM    ABC 0.05 07/15/2020 04:50 AM        Comprehensive Metabolic Profile    Lab Results   Component Value Date/Time    NA 142 12/21/2024 12:00 AM K 3.8 12/21/2024 12:00 AM    K 4.3 05/04/2024 02:39 PM    K 4.3 03/14/2024 12:00 AM    K 4.3 03/14/2024 12:00 AM    CL 107 12/21/2024 12:00 AM    CO2 21.0 (L) 12/21/2024 12:00 AM    GAP 14 12/21/2024 12:00 AM    BUN 34.0 (H) 12/21/2024 12:00 AM    CR 1.77 (H) 12/21/2024 12:00 AM    CR 1.75 (H) 05/04/2024 02:39 PM    CR 1.66 (H) 03/14/2024 12:00 AM    CR 1.66 (H) 03/14/2024 12:00 AM    GLU 93 12/21/2024 12:00 AM    Lab Results   Component Value Date/Time    CA 9.0 12/21/2024 12:00 AM    PO4 4.2 07/13/2020 07:00 AM    ALBUMIN 4.2 12/21/2024 12:00 AM    TOTPROT 6.7 12/21/2024 12:00 AM    ALKPHOS 53 12/21/2024 12:00 AM    AST 17 12/21/2024 12:00 AM    ALT 16 12/21/2024 12:00 AM    TOTBILI 0.81 12/21/2024 12:00 AM    GFR 37.2 12/21/2024 12:00 AM    GFRAA >60 07/15/2020 04:50 AM        Cardiac Enzymes Lipids   Lab Results   Component Value Date/Time    BNP 143 (H) 03/14/2024 12:00 AM    BNP 143 (H) 03/14/2024 12:00 AM  BNP 48.0 09/20/2006 03:24 PM    TNI 0.010 12/21/2024 12:00 AM    TNI 1.16 (H) 07/13/2020 04:50 PM    TNI 1.57 (H) 07/13/2020 07:00 AM    Lab Results   Component Value Date    CHOL 133 01/15/2024    TRIG 196 (H) 01/15/2024    HDL 33 (L) 01/15/2024    LDL 61 01/15/2024    VLDL 39 01/15/2024    NONHDLCHOL 106 07/12/2020    CHOLHDLC 4 01/15/2024         Coagulation Endocrine   Lab Results   Component Value Date/Time    INR 1.1 01/03/2014 04:27 AM    INR 1.1 01/02/2014 02:48 AM    INR 1.2 01/01/2014 04:37 AM    PT 16.4 (H) 05/18/2005 08:48 AM    Lab Results   Component Value Date/Time    HGBA1C 5.8 07/13/2020 03:17 AM    HGBA1C 5.9 04/05/2014 05:30 AM    HGBA1C 5.3 12/31/2013 05:01 AM    TSH 3.52 02/02/2020 12:00 AM             Imaging and procedures reviewed:  Echo:   04/2024:  Normal sized left ventricle. Normal LV wall thickness.  Prominent isolated basal septal hypertrophy  Normal left ventricular systolic function with estimated ejection fraction of 60-65% by visual assessment. No regional wall motion abnormalities.   Normal diastolic function with normal estimated left atrial pressure.  Normal sized right ventricle.  Mildly reduced right ventricular systolic function.  Normal sized left atrium. Normal sized right atrium..   Mild tricuspid regurgitation.  No evidence of pulmonary hypertension with estimated PA systolic pressure of 21 mmHg  No pericardial effusion.     Please see full report for additional details.    Prior study for comparison: 05/18/2022.  No significant change compared to prior echo.    05/18/22:  Technically challenging study with poor acoustic windows. The cardiac chambers and valves were not well visualized. IVS of 1.4 cm and PWT of 1.3 cm  Grossly normal left ventricular size with mildly increased left ventricular wall thickness. Normal left ventricular systolic function with an estimated ejection fraction of 65 to 70%. Indeterminate left ventricular diastolic function.  Mildly dilated right ventricle with grossly normal right ventricular systolic function.  The cardiac valves are not well-visualized. No significant stenosis or regurgitation by Doppler.  No significant pericardial effusion.  Comparison is made to transthoracic study dated 07/13/2020. No significant changes were found.    Stress test: 05/19/22:  1.  Normal myocardial perfusion scan without evidence of ischemia or infarction.  2.  Normal left ventricular systolic function with normal segmental wall thickening.  There is abnormal septal wall motion consistent with patient's prior history of coronary bypass surgery.  3.  Low risk myocardial perfusion scan.    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  80% to 90% stenosis in the mid saphenous vein graft in sequence to obtuse marginal 1 and obtuse marginal 2, which was thought to be the culprit lesion, with placement of a 4.0 x 28 mm Xience Skypoint stent that was post dilated with a 4.5 NC balloon.  Patent left internal mammary artery to left anterior descending artery graft, patent saphenous vein graft to right coronary artery graft with a patent stent in its midportion.  Patent saphenous vein graft-obtuse marginal graft, status post percutaneous coronary intervention to the 80% to 90% stenosis in its mid segment.  Low left  ventricular end-diastolic pressure.    1/06 - Cardiac cath: definite progression of CAD in SVG to RCA with focal 70-80%                   stenosis in mid portion of graft. Native RCA totally occluded. SVG to two Cx                   marginal branches widely patent, as was LIMA to LAD. LVEF 55%.  PCI/stent                   to SVG to RCA (bare metal stent).     12/2013 - DES to native, distal RCA beyond the insertion of vein graft Zane). Other finding: SVG patent to the OM 1 and 2 with a new eccentric 60% to 70% stenosis in the distal graft just before the 1st anastomosis. Our recommendation is to manage this conservatively.     05/11/24: [employer] PYP scan  SUMMARY/OPINION:   This study is negative and not suggestive of ATTR cardiac amyloidosis    AL amyloidosis also ruled out.    11/28/24: MPI stress test:  SUMMARY/OPINION:  This study is probably normal with no evidence of significant myocardial ischemia.  Diaphragmatic attenuation was noted with radiotracer inhomogeneity involving the basal inferolateral segment.  Regional global left ventricular systolic function is normal. There are no high risk prognostic indicators present.  The pharmacologic ECG portion of the study is negative for ischemia.     When compared to prior study dated 05/19/2022, prior study was interpreted as being normal with no significant perfusion abnormality.  EF was 55% in previous study.  Images are not available for side-to-side comparison of the perfusion patterns.    Immunization History   Administered Date(s) Administered    COVID-19 Bivalent (75YR+)(PFIZER), mRNA vacc, 30mcg/0.3mL 09/19/2021    Covid-19 mRNA Vaccine >=12yo (Pfizer)(Comirnaty) 09/21/2022, 09/06/2023, 09/20/2024     His labs from today were reviewed.  Troponin was negative.  Hemoglobin 13.1, WBC count 4.5, platelet count 140. sodium 142, potassium 3.8, chloride 107, bicarb 21, BUN 34, creatinine 1.7 which is stable for him.  Normal LFTs.    Assessment/Plan:  Devun Anna is a 84 y.o. 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  History of hypertension.  -He had recent MPI stress test done in December 2025 which shows no significant ischemia.  Details mentioned above.  -His EKG done on 12/21/24 in the ER shows A paced with right bundle branch block.  Heart rate of 60 bpm.  Plan  > For CAD continue aspirin  81 mg p.o. daily, Lipitor 80 mg daily.  Will also continue Imdur  15 mg p.o. daily.  > For hypertension we will continue carvedilol  12.5 mg p.o. twice daily.   >  Diuretics: lasix  40 mg p.o. daily ---> will increase it to 80 mg p.o. daily for 3 days and then change Lasix  to 60 mg p.o. daily for maintenance dose.  > Continue Jardiance 10 mg p.o. daily for the HFpEF  > Obtain echocardiogram in 5 months.     #Paroxysmal atrial fibrillation  #Status post dual-chamber pacemaker in place  - Follows with Dr. Quin.  He is on aspirin  81 mg p.o. daily and no anticoagulation given low burden of A-fib.  - Continue monitoring A-fib burden with device checks.  > Due to the device settings made above in January 2026 (see HPI above) we will have device check in next couple of weeks.  - He is currently on Coreg  12.5 mg p.o. twice daily, sotalol  120 mg p.o. twice daily.    #Multiple joint pains:   #History of right-sided carpal tunnel syndrome status post surgical release  He follows with rheumatology.  He is on prednisone  3 mg p.o. daily.    Return to heart failure clinic: NP in March 26 and me in 5 months.    Thank you for allowing me to participate in the care of this patient.  Please do not hesitate to contact me should you have any questions or concerns.         Total Time Today was 42 minutes in the following activities: Preparing to see the patient, Obtaining and/or reviewing separately obtained history, Performing a medically appropriate examination and/or evaluation, performing device interrogation, counseling and educating the patient/family/caregiver, Ordering medications, tests, or procedures, Referring and communication with other health care professionals (when not separately reported), Documenting clinical information in the electronic or other health record, Independently interpreting results (not separately reported) and communicating results to the patient/family/caregiver, and Care coordination (not separately reported)      Velva Pate, MD  Advanced [facility] Failure [facility] Heart Transplant Cardiologist  [facility]  [facility]  Pager: [phone]  Available on Voalte
